# Patient Record
Sex: Female | Born: 1990
Health system: Southern US, Community
[De-identification: ages and names within clinical notes are randomized; demographics above are authoritative.]

## PROBLEM LIST (undated history)

## (undated) DIAGNOSIS — K829 Disease of gallbladder, unspecified: Secondary | ICD-10-CM

## (undated) DIAGNOSIS — E282 Polycystic ovarian syndrome: Secondary | ICD-10-CM

## (undated) DIAGNOSIS — E039 Hypothyroidism, unspecified: Secondary | ICD-10-CM

---

## 2016-02-10 ENCOUNTER — Emergency Department: Payer: Managed Care, Other (non HMO)

## 2016-02-10 ENCOUNTER — Emergency Department
Admission: EM | Admit: 2016-02-10 | Discharge: 2016-02-10 | Disposition: A | Discharge status: Home / Self Care | Payer: Managed Care, Other (non HMO) | Attending: Emergency Medicine | Admitting: Emergency Medicine

## 2016-02-10 ENCOUNTER — Encounter: Payer: Self-pay | Admitting: Emergency Medicine

## 2016-02-10 DIAGNOSIS — K829 Disease of gallbladder, unspecified: Secondary | ICD-10-CM | POA: Insufficient documentation

## 2016-02-10 DIAGNOSIS — K8070 Calculus of gallbladder and bile duct without cholecystitis without obstruction: Secondary | ICD-10-CM | POA: Insufficient documentation

## 2016-02-10 DIAGNOSIS — K8050 Calculus of bile duct without cholangitis or cholecystitis without obstruction: Secondary | ICD-10-CM

## 2016-02-10 DIAGNOSIS — R112 Nausea with vomiting, unspecified: Secondary | ICD-10-CM | POA: Insufficient documentation

## 2016-02-10 DIAGNOSIS — E282 Polycystic ovarian syndrome: Secondary | ICD-10-CM | POA: Diagnosis not present

## 2016-02-10 DIAGNOSIS — R1011 Right upper quadrant pain: Secondary | ICD-10-CM | POA: Diagnosis present

## 2016-02-10 DIAGNOSIS — E039 Hypothyroidism, unspecified: Secondary | ICD-10-CM | POA: Insufficient documentation

## 2016-02-10 HISTORY — DX: Hypothyroidism, unspecified: E03.9

## 2016-02-10 HISTORY — DX: Disease of gallbladder, unspecified: K82.9

## 2016-02-10 HISTORY — DX: Polycystic ovarian syndrome: E28.2

## 2016-02-10 LAB — TROPONIN I: Troponin I: <0.03 ng/mL (ref <0.031)

## 2016-02-10 LAB — URINALYSIS COMPLETE WITH MICROSCOPIC (ARMC ONLY)
BILIRUBIN URINE: NEGATIVE
Bacteria, UA: NONE SEEN
GLUCOSE, UA: NEGATIVE mg/dL
KETONES UR: NEGATIVE mg/dL
LEUKOCYTES UA: NEGATIVE
Nitrite: NEGATIVE
Protein, ur: NEGATIVE mg/dL
RBC / HPF: NONE SEEN RBC/hpf (ref 0–5)
Specific Gravity, Urine: 1.006 (ref 1.005–1.030)
pH: 6 (ref 5.0–8.0)

## 2016-02-10 LAB — CBC
HCT: 36 % (ref 35.0–47.0)
Hemoglobin: 12.2 g/dL (ref 12.0–16.0)
MCH: 28.2 pg (ref 26.0–34.0)
MCHC: 33.9 g/dL (ref 32.0–36.0)
MCV: 83 fL (ref 80.0–100.0)
PLATELETS: 248 10*3/uL (ref 150–440)
RBC: 4.33 MIL/uL (ref 3.80–5.20)
RDW: 14.1 % (ref 11.5–14.5)
WBC: 9.5 10*3/uL (ref 3.6–11.0)

## 2016-02-10 LAB — COMPREHENSIVE METABOLIC PANEL
ALT: 19 U/L (ref 14–54)
AST: 21 U/L (ref 15–41)
Albumin: 4.2 g/dL (ref 3.5–5.0)
Alkaline Phosphatase: 59 U/L (ref 38–126)
Anion gap: 6 (ref 5–15)
BILIRUBIN TOTAL: 0.2 mg/dL — AB (ref 0.3–1.2)
BUN: 14 mg/dL (ref 6–20)
CHLORIDE: 108 mmol/L (ref 101–111)
CO2: 24 mmol/L (ref 22–32)
CREATININE: 0.77 mg/dL (ref 0.44–1.00)
Calcium: 8.9 mg/dL (ref 8.9–10.3)
GFR calc Af Amer: >60 mL/min (ref >60)
Glucose, Bld: 90 mg/dL (ref 65–99)
Potassium: 3.7 mmol/L (ref 3.5–5.1)
Sodium: 138 mmol/L (ref 135–145)
Total Protein: 7.4 g/dL (ref 6.5–8.1)

## 2016-02-10 LAB — LIPASE, BLOOD: LIPASE: 17 U/L (ref 11–51)

## 2016-02-10 LAB — POCT PREGNANCY, URINE: Preg Test, Ur: NEGATIVE

## 2016-02-10 MED ORDER — ONDANSETRON 4 MG PO TBDP: 4.0000 mg | ORAL_TABLET | Freq: Three times a day (TID) | ORAL | Status: DC | PRN

## 2016-02-10 MED ORDER — OXYCODONE-ACETAMINOPHEN 5-325 MG PO TABS: 1.0000 | ORAL_TABLET | Freq: Four times a day (QID) | ORAL | Status: DC | PRN

## 2016-02-10 MED ORDER — ONDANSETRON 4 MG PO TBDP
ORAL_TABLET | ORAL | Status: AC
  Filled 2016-02-10: qty 2

## 2016-02-10 MED ORDER — ONDANSETRON 4 MG PO TBDP
8.0000 mg | ORAL_TABLET | Freq: Once | ORAL | Status: AC
  Administered 2016-02-10: 8 mg via ORAL

## 2016-02-10 NOTE — ED Provider Notes (Signed)
[facility] Emergency Department Provider Note  ____________________________________________  Time seen: 9:30 PM  I have reviewed the triage vital signs and the nursing notes.   HISTORY  Chief Complaint Abdominal Pain    HPI Anne Henderson is a 26 y.o. female who complains of right upper quadrant abdominal pain that started suddenly and severely at 5:00 PM today. It is associated with nausea and vomiting. Is worse with eating and she has not eaten anything since 11:30 AM today. She's also had 5 loose bowel movements today. No change in stool color. No bloody stool.  She reports she has a history of gallstones but has never seen a Careers adviser. No other recent illnesses. No chest pain or shortness of breath. The pain is improved with exertion and walking. Seems to be worse with eating. Most recent pain lasted about 2 hours and she is felt essentially pain-free since about 7:00 PM. She does still have persistent nausea.     Past Medical History  Diagnosis Date  . Hypothyroidism   . Gallbladder disease   . PCOS (polycystic ovarian syndrome)      There are no active problems to display for this patient.    History reviewed. No pertinent past surgical history.   No current outpatient prescriptions on file. None  Allergies Review of patient's allergies indicates no known allergies.   No family history on file.  Social History Social History  Substance Use Topics  . Smoking status: Never Smoker   . Smokeless tobacco: None  . Alcohol Use: No    Review of Systems  Constitutional:   No fever or chills. No weight changes Eyes:   No blurry vision or double vision.  ENT:   No sore throat.  Cardiovascular:   No chest pain. Respiratory:   No dyspnea or cough. Gastrointestinal:   Right upper quadrant abdominal pain with nausea and vomiting. Positive diarrhea today.Marland Kitchen  No BRBPR or melena. Genitourinary:   Negative for dysuria or difficulty  urinating. Musculoskeletal:   Negative for back pain. No joint swelling or pain. Skin:   Negative for rash. Neurological:   Negative for headaches, focal weakness or numbness. Psychiatric:  No anxiety or depression.   Endocrine:  No changes in energy or sleep difficulty.  10-point ROS otherwise negative.  ____________________________________________   PHYSICAL EXAM:  VITAL SIGNS: ED Triage Vitals  Enc Vitals Group     BP 02/10/16 1951 141/98 mmHg     Pulse Rate 02/10/16 1951 104     Resp 02/10/16 1951 20     Temp 02/10/16 1951 98.2 F (36.8 C)     Temp Source 02/10/16 1951 Oral     SpO2 02/10/16 1951 100 %     Weight 02/10/16 1951 250 lb (113.399 kg)     Height 02/10/16 1951  (1.651 m)     Head Cir --      Peak Flow --      Pain Score 02/10/16 1949 6     Pain Loc --      Pain Edu? --      Excl. in GC? --     Vital signs reviewed, nursing assessments reviewed.   Constitutional:   Alert and oriented. Well appearing and in no distress. Eyes:   No scleral icterus. No conjunctival pallor. PERRL. EOMI ENT   Head:   Normocephalic and atraumatic.   Nose:   No congestion/rhinnorhea. No septal hematoma   Mouth/Throat:   MMM, no pharyngeal erythema. No peritonsillar mass.  Neck:   No stridor. No SubQ emphysema. No meningismus. Hematological/Lymphatic/Immunilogical:   No cervical lymphadenopathy. Cardiovascular:   RRR. Symmetric bilateral radial and DP pulses.  No murmurs.  Respiratory:   Normal respiratory effort without tachypnea nor retractions. Breath sounds are clear and equal bilaterally. No wheezes/rales/rhonchi. Gastrointestinal:   Soft with right upper quadrant tenderness. Non distended. There is no CVA tenderness.  No rebound, rigidity, or guarding. Genitourinary:   deferred Musculoskeletal:   Nontender with normal range of motion in all extremities. No joint effusions.  No lower extremity tenderness.  No edema. Neurologic:   Normal speech and  language.  CN 2-10 normal. Motor grossly intact. No gross focal neurologic deficits are appreciated.  Skin:    Skin is warm, dry and intact. No rash noted.  No petechiae, purpura, or bullae. Psychiatric:   Mood and affect are normal. ____________________________________________    LABS (pertinent positives/negatives) (all labs ordered are listed, but only abnormal results are displayed) Labs Reviewed  COMPREHENSIVE METABOLIC PANEL - Abnormal; Notable for the following:    Total Bilirubin 0.2 (*)    All other components within normal limits  URINALYSIS COMPLETEWITH MICROSCOPIC ([facility] ONLY) - Abnormal; Notable for the following:    Color, Urine COLORLESS (*)    APPearance CLEAR (*)    Hgb urine dipstick 1+ (*)    Squamous Epithelial / LPF 0-5 (*)    All other components within normal limits  LIPASE, BLOOD  CBC  TROPONIN I  POC URINE PREG, ED  POCT PREGNANCY, URINE   ____________________________________________   EKG  Interpreted by me Normal sinus rhythm rate of 93, normal axis and intervals. Normal QRS ST segments. There are T-wave inversions in lead 3 and aVF. No previous EKGs to compare to.  ____________________________________________    RADIOLOGY  Ultrasound right upper quadrant negative.  ____________________________________________   PROCEDURES   ____________________________________________   INITIAL IMPRESSION / ASSESSMENT AND PLAN / ED COURSE  Pertinent labs & imaging results that were available during my care of the patient were reviewed by me and considered in my medical decision making (see chart for details).  Patient presents with right upper quadrant abdominal pain consistent with her previous experience with gallstones. By the patient's history this sounds like it is biliary colic. She does have some T-wave inversions in the inferior leads on her EKG, but her symptoms are not at all consistent with ACS PE TAD or carditis. Low suspicion for  pneumothorax pneumonia or sepsis. Low suspicion for obstruction perforation pancreatitis or appendicitis or AAA. Troponin is negative. Patient's symptoms are not consistent with an angina equivalent and in fact actually improved with exertion. We'll give him ODT Zofran for now I'll obtaining an ultrasound of the gallbladder. If no severe obstruction or infection, will do a trial of oral intake and plan for outpatient follow-up with surgery for evaluation for elective cholecystectomy.   ----------------------------------------- 10:39 PM on 02/10/2016 -----------------------------------------  Ultrasound negative. Vital signs normal. Patient remains calm and comfortable without additional pain in the ED. Tolerating oral intake at this time. We'll provide Zofran and Percocet on an as-needed basis only K she has another severe tach. Follow-up with primary care. If she has ongoing symptoms I encouraged her to follow up with surgery regardless of the negative ultrasound given that her symptoms do seem to match biliary colic or a well. No diabetes. Well-appearing, stable for discharge home.    ____________________________________________   FINAL CLINICAL IMPRESSION(S) / ED DIAGNOSES  Final diagnoses:  Biliary colic  Non-intractable vomiting with nausea, vomiting of unspecified type      Sharman Cheek, MD 02/10/16 2240

## 2016-02-10 NOTE — ED Notes (Signed)
Developed upper abd pain about 2 hours ago  Vomited times 1 pos nausea

## 2016-02-10 NOTE — ED Notes (Signed)
Patient ambulatory to triage with steady gait, without difficulty or distress noted; pt reports  Upper abd pain since 5pm accomp by diarrhea x 5 and V x1; st hx of gallbladder disease

## 2016-02-10 NOTE — ED Notes (Signed)
Patient transported to Ultrasound 

## 2016-02-10 NOTE — Discharge Instructions (Signed)
You were prescribed a medication that is potentially sedating. Do not drink alcohol, drive or participate in any other potentially dangerous activities while taking this medication as it may make you sleepy. Do not take this medication with any other sedating medications, either prescription or over-the-counter. If you were prescribed Percocet or Vicodin, do not take these with acetaminophen (Tylenol) as it is already contained within these medications.   Opioid pain medications (or "narcotics") can be habit forming.  Use it as little as possible to achieve adequate pain control.  Do not use or use it with extreme caution if you have a history of opiate abuse or dependence.  If you are on a pain contract with your primary care doctor or a pain specialist, be sure to let them know you were prescribed this medication today from the [facility].  This medication is intended for your use only - do not give any to anyone else and keep it in a secure place where nobody else, especially children and pets, have access to it.  It will also cause or worsen constipation, so you may want to consider taking an over-the-counter stool softener while you are taking this medication.  Nausea and Vomiting Nausea is a sick feeling that often comes before throwing up (vomiting). Vomiting is a reflex where stomach contents come out of your mouth. Vomiting can cause severe loss of body fluids (dehydration). Children and elderly adults can become dehydrated quickly, especially if they also have diarrhea. Nausea and vomiting are symptoms of a condition or disease. It is important to find the cause of your symptoms. CAUSES   Direct irritation of the stomach lining. This irritation can result from increased acid production (gastroesophageal reflux disease), infection, food poisoning, taking certain medicines (such as nonsteroidal anti-inflammatory drugs), alcohol use, or tobacco use.  Signals from the  brain.These signals could be caused by a headache, heat exposure, an inner ear disturbance, increased pressure in the brain from injury, infection, a tumor, or a concussion, pain, emotional stimulus, or metabolic problems.  An obstruction in the gastrointestinal tract (bowel obstruction).  Illnesses such as diabetes, hepatitis, gallbladder problems, appendicitis, kidney problems, cancer, sepsis, atypical symptoms of a heart attack, or eating disorders.  Medical treatments such as chemotherapy and radiation.  Receiving medicine that makes you sleep (general anesthetic) during surgery. DIAGNOSIS Your caregiver may ask for tests to be done if the problems do not improve after a few days. Tests may also be done if symptoms are severe or if the reason for the nausea and vomiting is not clear. Tests may include:  Urine tests.  Blood tests.  Stool tests.  Cultures (to look for evidence of infection).  X-rays or other imaging studies. Test results can help your caregiver make decisions about treatment or the need for additional tests. TREATMENT You need to stay well hydrated. Drink frequently but in small amounts.You may wish to drink water, sports drinks, clear broth, or eat frozen ice pops or gelatin dessert to help stay hydrated.When you eat, eating slowly may help prevent nausea.There are also some antinausea medicines that may help prevent nausea. HOME CARE INSTRUCTIONS   Take all medicine as directed by your caregiver.  If you do not have an appetite, do not force yourself to eat. However, you must continue to drink fluids.  If you have an appetite, eat a normal diet unless your caregiver tells you differently.  Eat a variety of complex carbohydrates (rice, wheat, potatoes, bread), lean meats, yogurt,  fruits, and vegetables.  Avoid high-fat foods because they are more difficult to digest.  Drink enough water and fluids to keep your urine clear or pale yellow.  If you are  dehydrated, ask your caregiver for specific rehydration instructions. Signs of dehydration may include:  Severe thirst.  Dry lips and mouth.  Dizziness.  Dark urine.  Decreasing urine frequency and amount.  Confusion.  Rapid breathing or pulse. SEEK IMMEDIATE MEDICAL CARE IF:   You have blood or brown flecks (like coffee grounds) in your vomit.  You have black or bloody stools.  You have a severe headache or stiff neck.  You are confused.  You have severe abdominal pain.  You have chest pain or trouble breathing.  You do not urinate at least once every 8 hours.  You develop cold or clammy skin.  You continue to vomit for longer than 24 to 48 hours.  You have a fever. MAKE SURE YOU:   Understand these instructions.  Will watch your condition.  Will get help right away if you are not doing well or get worse.   This information is not intended to replace advice given to you by your health care provider. Make sure you discuss any questions you have with your health care provider.   Document Released: 11/15/2005 Document Revised: 02/07/2012 Document Reviewed: 04/14/2011 Elsevier Interactive Patient Education [employer]

## 2016-02-10 NOTE — ED Notes (Signed)
Per MD started PO challenge - gave cup of water and saltine crackers

## 2016-02-10 NOTE — ED Notes (Signed)
Pt reports abd pain for 1 day - She reports that she has gall bladder problems and that this pain feels like the gall bladder pain that she usually has every two weeks or so - Pt report vomiting and diarrhea

## 2016-07-30 ENCOUNTER — Encounter: Payer: Self-pay | Admitting: Physician Assistant

## 2016-07-30 ENCOUNTER — Ambulatory Visit: Payer: Self-pay | Admitting: Physician Assistant

## 2016-07-30 VITALS — BP 110/70 | HR 66 | Temp 98.4°F

## 2016-07-30 DIAGNOSIS — R109 Unspecified abdominal pain: Secondary | ICD-10-CM

## 2016-07-30 NOTE — Progress Notes (Signed)
   Subjective:    Patient ID: Anne Henderson, female    DOB: 09-18-1991, 25 y.o.   MRN: [id number]  HPI Patient c/o 3 days of diffuse abdomen  pain. One episode of vomiting 2 days ago. Patient also c/o fatigue, decreased appetite and constipation.Patient states urinary urgency and frequency but no compliant today. Denies dysuria.   Review of Systems  Hypothyroidism and PCOS    Objective:   Physical Exam No acute distress. Obese. HEENT unremarkable. Neck supple with adenopathy. Lungs CTA. Heart RRR. Obese abdomen. Negative HSM. Decrease BS. Diffused guarding mid and lower abdomen.       Assessment & Plan:Abdominal pain. Hypothyroidism, and APCOS.  CMP, TSH, HgA1c, and UA today. Follow up in 4 days.

## 2016-07-31 LAB — CMP12+LP+TP+TSH+6AC+CBC/D/PLT
A/G RATIO: 1.8 (ref 1.2–2.2)
ALBUMIN: 4.4 g/dL (ref 3.5–5.5)
ALK PHOS: 68 IU/L (ref 39–117)
ALT: 14 IU/L (ref 0–32)
AST: 15 IU/L (ref 0–40)
BASOS ABS: 0 10*3/uL (ref 0.0–0.2)
BASOS: 0 %
BUN / CREAT RATIO: 13 (ref 9–23)
BUN: 10 mg/dL (ref 6–20)
CHLORIDE: 102 mmol/L (ref 96–106)
CHOLESTEROL TOTAL: 186 mg/dL (ref 100–199)
Calcium: 9.1 mg/dL (ref 8.7–10.2)
Chol/HDL Ratio: 4 ratio units (ref 0.0–4.4)
Creatinine, Ser: 0.78 mg/dL (ref 0.57–1.00)
EOS (ABSOLUTE): 0.1 10*3/uL (ref 0.0–0.4)
EOS: 2 %
ESTIMATED CHD RISK: 0.9 times avg. (ref 0.0–1.0)
FREE THYROXINE INDEX: 1.6 (ref 1.2–4.9)
GFR calc Af Amer: 123 mL/min/{1.73_m2} (ref >59)
GFR calc non Af Amer: 107 mL/min/{1.73_m2} (ref >59)
GGT: 10 IU/L (ref 0–60)
GLUCOSE: 99 mg/dL (ref 65–99)
Globulin, Total: 2.5 g/dL (ref 1.5–4.5)
HDL: 46 mg/dL (ref >39)
HEMATOCRIT: 37.7 % (ref 34.0–46.6)
HEMOGLOBIN: 12.6 g/dL (ref 11.1–15.9)
IMMATURE GRANS (ABS): 0 10*3/uL (ref 0.0–0.1)
IMMATURE GRANULOCYTES: 0 %
IRON: 30 ug/dL (ref 27–159)
LDH: 154 IU/L (ref 119–226)
LDL CALC: 125 mg/dL — AB (ref 0–99)
LYMPHS ABS: 1.7 10*3/uL (ref 0.7–3.1)
LYMPHS: 25 %
MCH: 27.8 pg (ref 26.6–33.0)
MCHC: 33.4 g/dL (ref 31.5–35.7)
MCV: 83 fL (ref 79–97)
MONOCYTES: 9 %
Monocytes Absolute: 0.6 10*3/uL (ref 0.1–0.9)
NEUTROS PCT: 64 %
Neutrophils Absolute: 4.4 10*3/uL (ref 1.4–7.0)
PLATELETS: 301 10*3/uL (ref 150–379)
Phosphorus: 3.1 mg/dL (ref 2.5–4.5)
Potassium: 4.3 mmol/L (ref 3.5–5.2)
RBC: 4.54 x10E6/uL (ref 3.77–5.28)
RDW: 14.4 % (ref 12.3–15.4)
SODIUM: 139 mmol/L (ref 134–144)
T3 UPTAKE RATIO: 27 % (ref 24–39)
T4, Total: 6 ug/dL (ref 4.5–12.0)
TOTAL PROTEIN: 6.9 g/dL (ref 6.0–8.5)
TSH: 4.53 u[IU]/mL — ABNORMAL HIGH (ref 0.450–4.500)
Triglycerides: 74 mg/dL (ref 0–149)
Uric Acid: 5.9 mg/dL (ref 2.5–7.1)
VLDL CHOLESTEROL CAL: 15 mg/dL (ref 5–40)
WBC: 6.8 10*3/uL (ref 3.4–10.8)

## 2016-07-31 LAB — URINALYSIS, COMPLETE
BILIRUBIN UA: NEGATIVE
Glucose, UA: NEGATIVE
KETONES UA: NEGATIVE
Leukocytes, UA: NEGATIVE
Nitrite, UA: NEGATIVE
PH UA: 6.5 (ref 5.0–7.5)
Protein, UA: NEGATIVE
RBC UA: NEGATIVE
Specific Gravity, UA: 1.021 (ref 1.005–1.030)
Urobilinogen, Ur: 0.2 mg/dL (ref 0.2–1.0)

## 2016-07-31 LAB — MICROSCOPIC EXAMINATION
Bacteria, UA: NONE SEEN
Casts: NONE SEEN /lpf

## 2016-07-31 LAB — HGB A1C W/O EAG: Hgb A1c MFr Bld: 5.6 % (ref 4.8–5.6)

## 2016-08-03 ENCOUNTER — Other Ambulatory Visit: Payer: Self-pay | Admitting: Physician Assistant

## 2016-08-03 ENCOUNTER — Encounter: Payer: Self-pay | Admitting: Physician Assistant

## 2016-08-03 ENCOUNTER — Ambulatory Visit: Payer: Self-pay | Admitting: Physician Assistant

## 2016-08-03 VITALS — BP 110/70 | HR 66 | Temp 98.5°F

## 2016-08-03 DIAGNOSIS — R1030 Lower abdominal pain, unspecified: Secondary | ICD-10-CM

## 2016-08-03 MED ORDER — LEVOTHYROXINE SODIUM 75 MCG PO TABS: 75.0000 ug | ORAL_TABLET | Freq: Every day | ORAL | 3 refills | Status: DC

## 2016-08-03 NOTE — Progress Notes (Signed)
S: here for lab review, states is feeling better than she did on Friday, states she keeps having this abdominal pain on and off, usually is upper gi pain after eating fatty foods and will feel like she needs to run to the bathroom,  this time it was near her belly button and radiated to the rlq, did have fever at the time but none now, no v/d, no blood in stool or urine, hx of PCO, Hashimoto thyroid, was also told she is having gallbladder issues, had an US done in the ER in ?March but they didn't find anything, sx have continued on and off and continues to take zofran to help with constant nausea,   O: vitals wnl, nad, abd soft mildly tender in rlq and ruq, bs normal all 4 quads, lungs c t a, cv rrr, neuro intact, labs reviewed  A: hypothyroidism, abdominal pain  P: increase synthroid to 75mcg, add labs for h. Pylori and protein allergy, pt states already had celiac workup but it was neg, refer to GI for eval of ? nonfunctioning gallbladder and abdominal pain

## 2016-08-06 LAB — H PYLORI, IGM, IGG, IGA AB
H pylori, IgM Abs: <9 units (ref 0.0–8.9)
H. pylori, IgA Abs: <9 units (ref 0.0–8.9)

## 2016-08-06 LAB — ALPHA GAL IGE

## 2016-08-10 NOTE — Progress Notes (Signed)
Patient ID: Anne Henderson, female   DOB: 02-Nov-1991, 25 y.o.   MRN: [id number] Patient have been [facility] to [facility].  Her appointment is 08/17/2016 @ 9am.  Patient has been notified.

## 2016-08-17 ENCOUNTER — Other Ambulatory Visit: Payer: Self-pay | Admitting: Nurse Practitioner

## 2016-08-17 DIAGNOSIS — R112 Nausea with vomiting, unspecified: Secondary | ICD-10-CM

## 2016-08-20 ENCOUNTER — Ambulatory Visit
Admission: RE | Admit: 2016-08-20 | Discharge: 2016-08-20 | Disposition: A | Discharge status: Home / Self Care | Payer: Managed Care, Other (non HMO) | Source: Ambulatory Visit | Attending: Nurse Practitioner | Admitting: Nurse Practitioner

## 2016-08-20 ENCOUNTER — Other Ambulatory Visit: Payer: Self-pay | Admitting: Nurse Practitioner

## 2016-08-20 DIAGNOSIS — R112 Nausea with vomiting, unspecified: Secondary | ICD-10-CM | POA: Diagnosis not present

## 2016-08-20 MED ORDER — TECHNETIUM TC 99M MEBROFENIN IV KIT
5.0000 | PACK | Freq: Once | INTRAVENOUS | Status: AC | PRN
  Administered 2016-08-20: 4.76 via INTRAVENOUS

## 2016-11-11 ENCOUNTER — Encounter: Payer: Self-pay | Admitting: *Deleted

## 2016-11-12 ENCOUNTER — Encounter: Payer: Self-pay | Admitting: Anesthesiology

## 2016-11-12 ENCOUNTER — Ambulatory Visit: Payer: Managed Care, Other (non HMO) | Admitting: Anesthesiology

## 2016-11-12 ENCOUNTER — Ambulatory Visit
Admission: RE | Admit: 2016-11-12 | Discharge: 2016-11-12 | Disposition: A | Discharge status: Home / Self Care | Payer: Managed Care, Other (non HMO) | Source: Ambulatory Visit | Attending: Unknown Physician Specialty | Admitting: Unknown Physician Specialty

## 2016-11-12 ENCOUNTER — Encounter
Admission: RE | Disposition: A | Discharge status: Home / Self Care | Payer: Self-pay | Source: Ambulatory Visit | Attending: Unknown Physician Specialty

## 2016-11-12 DIAGNOSIS — K529 Noninfective gastroenteritis and colitis, unspecified: Secondary | ICD-10-CM | POA: Insufficient documentation

## 2016-11-12 DIAGNOSIS — E282 Polycystic ovarian syndrome: Secondary | ICD-10-CM | POA: Diagnosis not present

## 2016-11-12 DIAGNOSIS — K2960 Other gastritis without bleeding: Secondary | ICD-10-CM | POA: Diagnosis not present

## 2016-11-12 DIAGNOSIS — E039 Hypothyroidism, unspecified: Secondary | ICD-10-CM | POA: Insufficient documentation

## 2016-11-12 DIAGNOSIS — Z79899 Other long term (current) drug therapy: Secondary | ICD-10-CM | POA: Insufficient documentation

## 2016-11-12 DIAGNOSIS — Z6841 Body Mass Index (BMI) 40.0 and over, adult: Secondary | ICD-10-CM | POA: Diagnosis not present

## 2016-11-12 DIAGNOSIS — Z7984 Long term (current) use of oral hypoglycemic drugs: Secondary | ICD-10-CM | POA: Insufficient documentation

## 2016-11-12 DIAGNOSIS — R110 Nausea: Secondary | ICD-10-CM | POA: Diagnosis present

## 2016-11-12 HISTORY — PX: COLONOSCOPY WITH PROPOFOL: SHX5780

## 2016-11-12 HISTORY — PX: ESOPHAGOGASTRODUODENOSCOPY (EGD) WITH PROPOFOL: SHX5813

## 2016-11-12 LAB — POCT PREGNANCY, URINE: Preg Test, Ur: NEGATIVE

## 2016-11-12 SURGERY — COLONOSCOPY WITH PROPOFOL
Anesthesia: General

## 2016-11-12 MED ORDER — SODIUM CHLORIDE 0.9 % IV SOLN: INTRAVENOUS | Status: DC

## 2016-11-12 MED ORDER — GLYCOPYRROLATE 0.2 MG/ML IJ SOLN
INTRAMUSCULAR | Status: DC | PRN
  Administered 2016-11-12: 0.2 mg via INTRAVENOUS

## 2016-11-12 MED ORDER — LIDOCAINE 2% (20 MG/ML) 5 ML SYRINGE
INTRAMUSCULAR | Status: DC | PRN
  Administered 2016-11-12: 40 mg via INTRAVENOUS

## 2016-11-12 MED ORDER — PROPOFOL 10 MG/ML IV BOLUS
INTRAVENOUS | Status: DC | PRN
  Administered 2016-11-12: 100 mg via INTRAVENOUS

## 2016-11-12 MED ORDER — PROPOFOL 500 MG/50ML IV EMUL
INTRAVENOUS | Status: DC | PRN
  Administered 2016-11-12: 180 ug/kg/min via INTRAVENOUS

## 2016-11-12 MED ORDER — FENTANYL CITRATE (PF) 100 MCG/2ML IJ SOLN
INTRAMUSCULAR | Status: DC | PRN
  Administered 2016-11-12: 50 ug via INTRAVENOUS

## 2016-11-12 MED ORDER — SODIUM CHLORIDE 0.9 % IV SOLN
INTRAVENOUS | Status: DC
  Administered 2016-11-12: 1000 mL via INTRAVENOUS
  Administered 2016-11-12: 10:00:00 via INTRAVENOUS

## 2016-11-12 MED ORDER — LIDOCAINE HCL (PF) 1 % IJ SOLN
2.0000 mL | Freq: Once | INTRAMUSCULAR | Status: DC
  Filled 2016-11-12: qty 2

## 2016-11-12 MED ORDER — MIDAZOLAM HCL 5 MG/5ML IJ SOLN
INTRAMUSCULAR | Status: DC | PRN
  Administered 2016-11-12: 1 mg via INTRAVENOUS

## 2016-11-12 MED ORDER — PHENYLEPHRINE HCL 10 MG/ML IJ SOLN
INTRAMUSCULAR | Status: DC | PRN
  Administered 2016-11-12: 100 ug via INTRAVENOUS

## 2016-11-12 NOTE — Anesthesia Preprocedure Evaluation (Signed)
Anesthesia Evaluation  Patient identified by MRN, date of birth, ID band Patient awake    Reviewed: Allergy & Precautions, H&P , NPO status , Patient's Chart, lab work & pertinent test results, reviewed documented beta blocker date and time   History of Anesthesia Complications Negative for: history of anesthetic complications  Airway Mallampati: I  TM Distance: >3 FB Neck ROM: full    Dental no notable dental hx. (+) Chipped, Teeth Intact   Pulmonary neg pulmonary ROS,    Pulmonary exam normal breath sounds clear to auscultation       Cardiovascular Exercise Tolerance: Good negative cardio ROS Normal cardiovascular exam Rhythm:regular Rate:Normal     Neuro/Psych negative neurological ROS  negative psych ROS   GI/Hepatic negative GI ROS, Neg liver ROS,   Endo/Other  neg diabetesHypothyroidism Morbid obesity  Renal/GU negative Renal ROS  negative genitourinary   Musculoskeletal   Abdominal   Peds  Hematology negative hematology ROS (+)   Anesthesia Other Findings Past Medical History: No date: Gallbladder disease No date: Hypothyroidism No date: PCOS (polycystic ovarian syndrome)   Reproductive/Obstetrics negative OB ROS                             Anesthesia Physical Anesthesia Plan  ASA: III  Anesthesia Plan: General   Post-op Pain Management:    Induction:   Airway Management Planned:   Additional Equipment:   Intra-op Plan:   Post-operative Plan:   Informed Consent: I have reviewed the patients History and Physical, chart, labs and discussed the procedure including the risks, benefits and alternatives for the proposed anesthesia with the patient or authorized representative who has indicated his/her understanding and acceptance.   Dental Advisory Given  Plan Discussed with: Anesthesiologist, CRNA and Surgeon  Anesthesia Plan Comments:         Anesthesia  Quick Evaluation

## 2016-11-12 NOTE — Op Note (Signed)
[facility] Gastroenterology Patient Name: Anne FermoWhitney Todisco Procedure Date: 11/12/2016 10:06 AM MRN: [id number] Account #: [id number] Date of Birth: February 17, 1991 Admit Type: Outpatient Age: [age] [facility] ENDO ROOM 4 Gender: Female Note Status: Finalized Procedure:            Colonoscopy Indications:          Chronic diarrhea Providers:            Scot Junobert T. Xzaiver Vayda, MD Referring MD:         No Local Md, MD (Referring MD) Medicines:            Propofol per Anesthesia Complications:        No immediate complications. Procedure:            Pre-Anesthesia Assessment:                       - After reviewing the risks and benefits, the patient                        was deemed in satisfactory condition to undergo the                        procedure.                       After obtaining informed consent, the colonoscope was                        passed under direct vision. Throughout the procedure,                        the patient's blood pressure, pulse, and oxygen                        saturations were monitored continuously. The                        Colonoscope was introduced through the anus and                        advanced to the the cecum, identified by appendiceal                        orifice and ileocecal valve. The colonoscopy was                        performed without difficulty. The patient tolerated the                        procedure well. The quality of the bowel preparation                        was excellent. Findings:      Normal mucosa was found in the entire colon. Biopsies were taken with a       cold forceps for histology due to chronic diarrhea.. Done of ascending,       transverse, descending and sigmoid colon. Terminal ileum entered and was       normal also. Impression:           - Normal mucosa in the entire examined colon. Biopsied. Recommendation:       -  Await pathology results. Discuss metformin. Scot Junobert T Bambie Pizzolato,  MD 11/12/2016 10:41:28 AM This report has been signed electronically. Number of Addenda: 0 Note Initiated On: 11/12/2016 10:06 AM Scope Withdrawal Time: 0 hours 8 minutes 24 seconds  Total Procedure Duration: 0 hours 13 minutes 24 seconds       [facility]

## 2016-11-12 NOTE — Transfer of Care (Signed)
Immediate Anesthesia Transfer of Care Note  Patient: Anne FermoWhitney Pall  Procedure(s) Performed: Procedure(s): COLONOSCOPY WITH PROPOFOL (N/A) ESOPHAGOGASTRODUODENOSCOPY (EGD) WITH PROPOFOL (N/A)  Patient Location: PACU and Endoscopy Unit  Anesthesia Type:General  Level of Consciousness: awake and patient cooperative  Airway & Oxygen Therapy: Patient Spontanous Breathing and Patient connected to nasal cannula oxygen  Post-op Assessment: Report given to RN and Post -op Vital signs reviewed and stable  Post vital signs: Reviewed and stable  Last Vitals:  Vitals:   11/12/16 0919  BP: 117/70  Pulse: 76  Resp: 17  Temp: (!) 35.6 C    Last Pain:  Vitals:   11/12/16 0919  TempSrc: Tympanic         Complications: No apparent anesthesia complications

## 2016-11-12 NOTE — Anesthesia Postprocedure Evaluation (Signed)
Anesthesia Post Note  Patient: Anne Henderson  Procedure(s) Performed: Procedure(s) (LRB): COLONOSCOPY WITH PROPOFOL (N/A) ESOPHAGOGASTRODUODENOSCOPY (EGD) WITH PROPOFOL (N/A)  Patient location during evaluation: Endoscopy Anesthesia Type: General Level of consciousness: awake and alert Pain management: pain level controlled Vital Signs Assessment: post-procedure vital signs reviewed and stable Respiratory status: spontaneous breathing, nonlabored ventilation, respiratory function stable and patient connected to nasal cannula oxygen Cardiovascular status: blood pressure returned to baseline and stable Postop Assessment: no signs of nausea or vomiting Anesthetic complications: no    Last Vitals:  Vitals:   11/12/16 1116 11/12/16 1126  BP: 117/64 112/64  Pulse: 62 63  Resp: 16 19  Temp:      Last Pain:  Vitals:   11/12/16 1046  TempSrc: Tympanic  PainSc: Asleep                 Lenard SimmerAndrew Crandall [place]

## 2016-11-12 NOTE — Op Note (Signed)
[facility] Gastroenter[facility] Patient Name: Anne FermoWhitney Ruggles Procedure Date: 11/12/2016 10:06 AM MRN: [id number] Account #: [id number] Date of Birth: [birth year] Admit Type: Outpatient Age: [age] [facility] 4 Gender: Female Note Status: Finalized Procedure:            Upper GI endoscopy Indications:          Diarrhea, Nausea Providers:            Scot Junobert T. Elliott, MD Referring MD:         No Local Md, MD (Referring MD) Medicines:            Propofol per Anesthesia Complications:        No immediate complications. Procedure:            Pre-Anesthesia Assessment:                       - After reviewing the risks and benefits, the patient                        was deemed in satisfactory condition to undergo the                        procedure.                       After obtaining informed consent, the endoscope was                        passed under direct vision. Throughout the procedure,                        the patient's blood pressure, pulse, and oxygen                        saturations were monitored continuously. The Endoscope                        was introduced through the mouth, and advanced to the                        second part of duodenum. The upper GI endoscopy was                        accomplished without difficulty. The patient tolerated                        the procedure well. Findings:      The examined esophagus was normal. GEJ 39cm.      Three dispersed, small non-bleeding erosions were found in the gastric       antrum. There were no stigmata of recent bleeding. Biopsies were taken       with a cold forceps for histology. Biopsies were taken with a cold       forceps for Helicobacter pylori testing.      The examined duodenum was normal. Biopsies were taken with a cold       forceps for histology. Done for possible celiac disease. Impression:           - Normal esophagus.                       -  Non-bleeding erosive  gastropathy. Biopsied.                       - Normal examined duodenum. Biopsied. Recommendation:       - Await pathology results. Take Carafate and Omeprazole                        for 8 weeks, continue Omeprazole if continue ibuprofen. Scot Junobert T Elliott, MD 11/12/2016 10:22:23 AM This report has been signed electronically. Number of Addenda: 0 Note Initiated On: 11/12/2016 10:06 AM      [facility]

## 2016-11-12 NOTE — H&P (Signed)
   Primary Care Physician:  No PCP Per Patient Primary Gastroenterologist:  Dr. Mechele CollinElliott  Pre-Procedure History & Physical: HPI:  Anne Henderson is a [age] y.o. female is here for an endoscopy and colonoscopy.   Past Medical History:  Diagnosis Date  . Gallbladder disease   . Hypothyroidism   . PCOS (polycystic ovarian syndrome)     History reviewed. No pertinent surgical history.  Prior to Admission medications   Medication Sig Start Date End Date Taking? Authorizing Provider  levothyroxine (SYNTHROID, LEVOTHROID) 75 MCG tablet Take 1 tablet (75 mcg total) by mouth daily. 08/03/16   Faythe GheeSusan W Fisher, PA-C  metFORMIN (GLUCOPHAGE) 500 MG tablet Take 500 mg by mouth 3 (three) times daily.    Historical Provider, MD  ondansetron (ZOFRAN ODT) 4 MG disintegrating tablet Take 1 tablet (4 mg total) by mouth every 8 (eight) hours as needed for nausea or vomiting. Patient not taking: Reported on 07/30/2016 02/10/16   Sharman CheekPhillip Stafford, MD  oxyCODONE-acetaminophen (ROXICET) 5-325 MG tablet Take 1 tablet by mouth every 6 (six) hours as needed for severe pain. Patient not taking: Reported on 07/30/2016 02/10/16   Sharman CheekPhillip Stafford, MD    Allergies as of 10/27/2016  . (No Known Allergies)    History reviewed. No pertinent family history.  Social History   Social History  . Marital status: Single    Spouse name: N/A  . Number of children: N/A  . Years of education: N/A   Occupational History  . Not on file.   Social History Main Topics  . Smoking status: Never Smoker  . Smokeless tobacco: Not on file  . Alcohol use No  . Drug use: Unknown  . Sexual activity: Not on file   Other Topics Concern  . Not on file   Social History Narrative  . No narrative on file    Review of Systems: See HPI, otherwise negative ROS  Physical Exam: BP 117/70   Pulse 76   Temp (!) 96.1 F (35.6 C) (Tympanic)   Resp 17   Ht 5\' 6"  (1.676 m)   Wt 113.4 kg (250 lb)   SpO2 99%   BMI 40.35 kg/m  General:    Alert,  pleasant and cooperative in NAD Head:  Normocephalic and atraumatic. Neck:  Supple; no masses or thyromegaly. Lungs:  Clear throughout to auscultation.    Heart:  Regular rate and rhythm. Abdomen:  Soft, nontender and nondistended. Normal bowel sounds, without guarding, and without rebound.   Neurologic:  Alert and  oriented x4;  grossly normal neurologically.  Impression/Plan: Anne Henderson is here for an endoscopy and colonoscopy to be performed for chronic diarrhea  Risks, benefits, limitations, and alternatives regarding  endoscopy and colonoscopy have been reviewed with the patient.  Questions have been answered.  All parties agreeable.   Lynnae PrudeELLIOTT, Nelton Amsden, MD  11/12/2016, 10:03 AM

## 2016-11-13 ENCOUNTER — Encounter: Payer: Self-pay | Admitting: Unknown Physician Specialty

## 2016-11-15 LAB — SURGICAL PATHOLOGY

## 2017-02-19 IMAGING — US US ABDOMEN LIMITED
1 series · 14 of 25 positions shown · non-contrast
Comparison: 02/10/2016

CLINICAL DATA: Nausea, vomiting

EXAM:
US ABDOMEN LIMITED - RIGHT UPPER QUADRANT

[Series 1: us abdomen limited · 0.25mm/px · 14 of 52 slices shown]
[im 1/52]
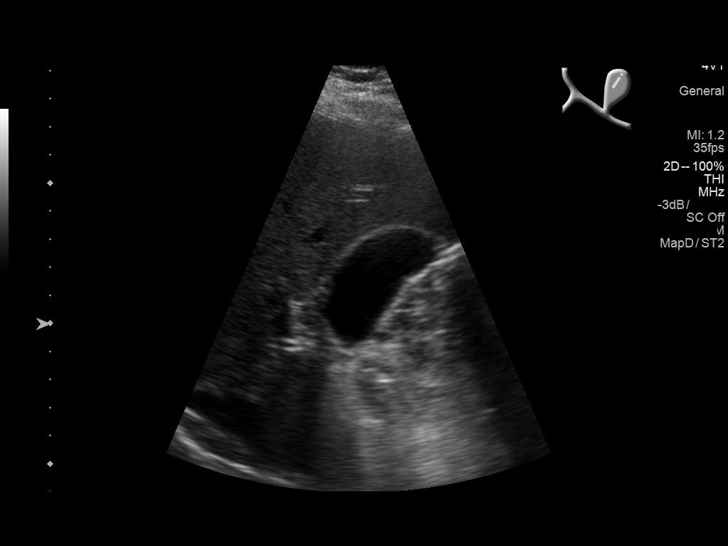
[im 5/52]
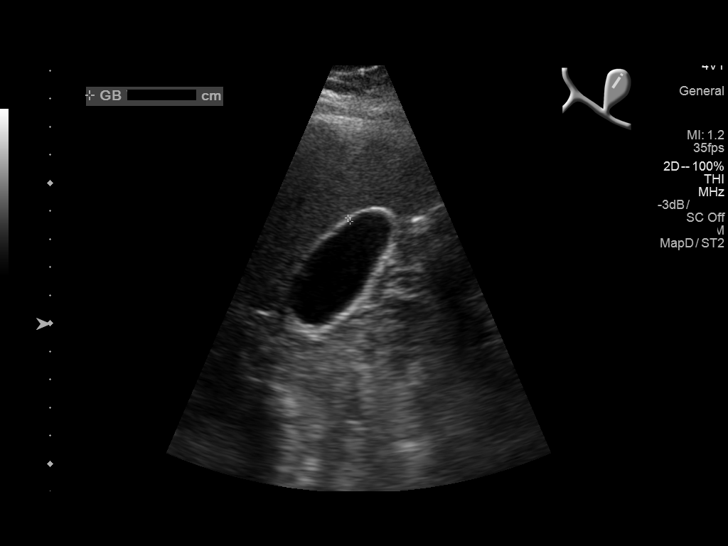
[im 9/52]
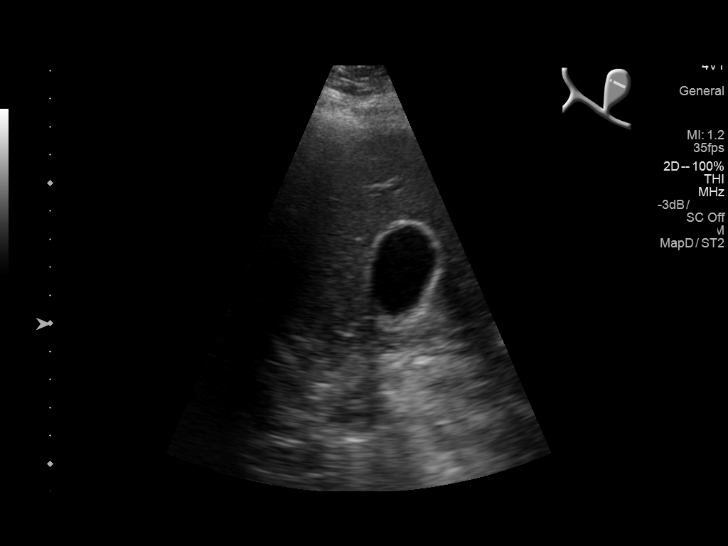
[im 13/52]
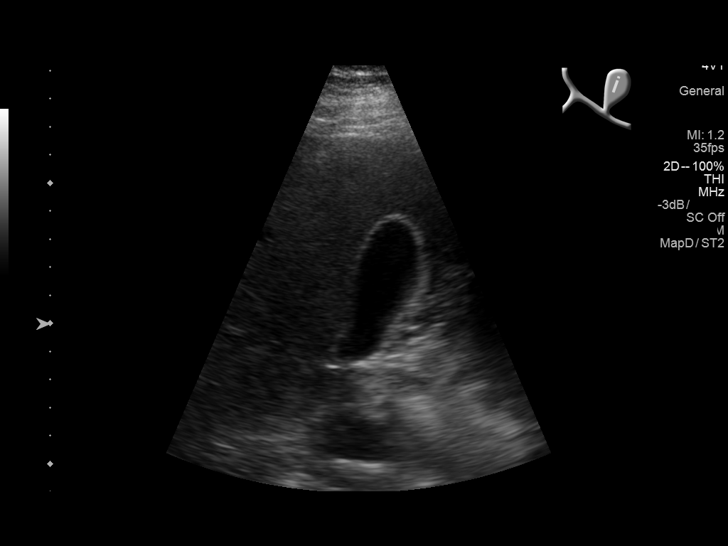
[im 18/52]
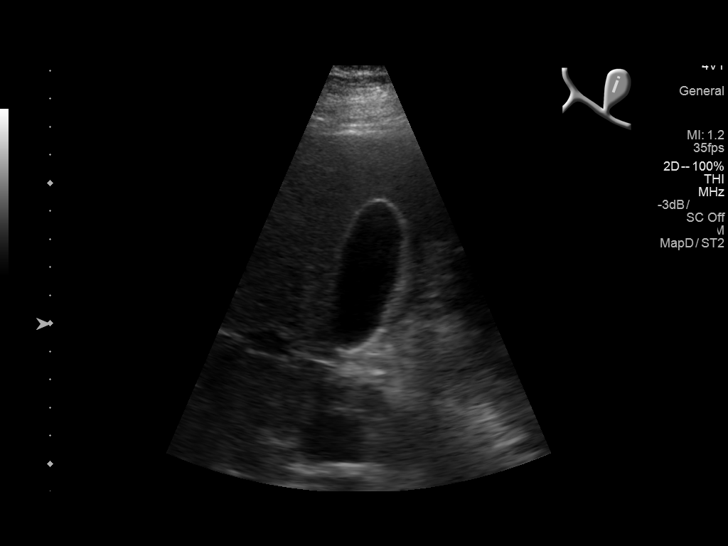
[im 20/52]
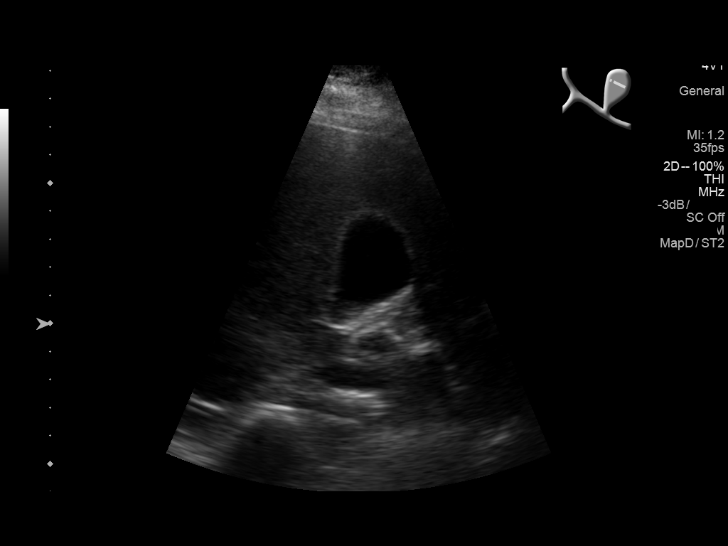
[im 24/52]
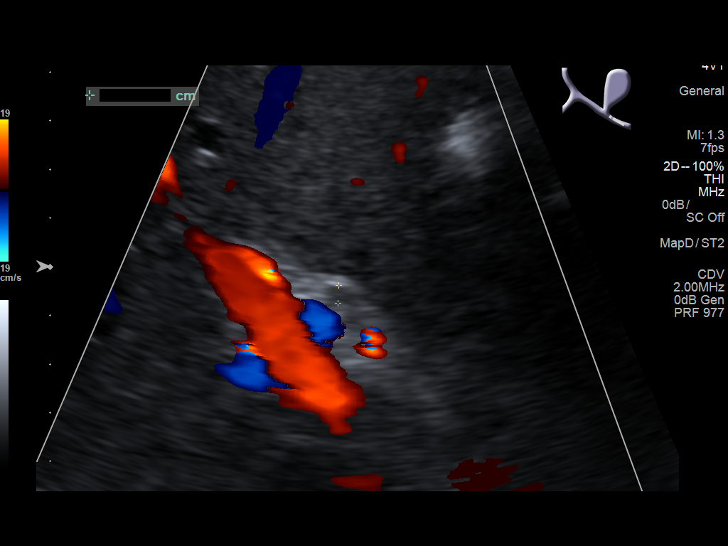
[im 28/52]
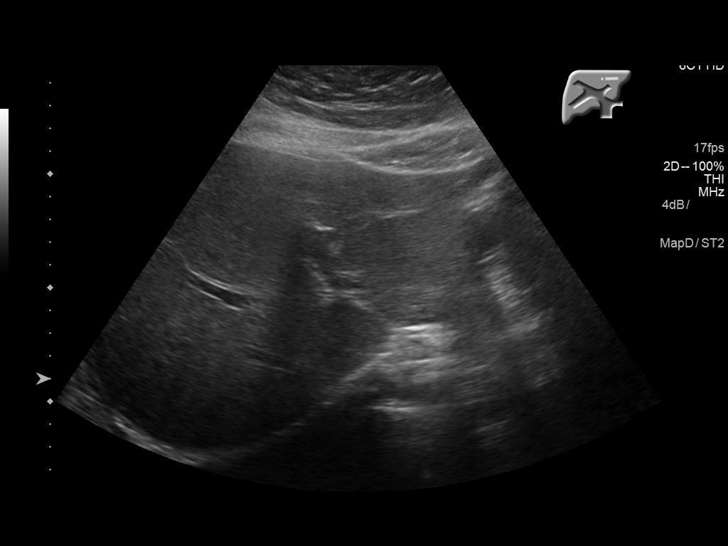
[im 32/52]
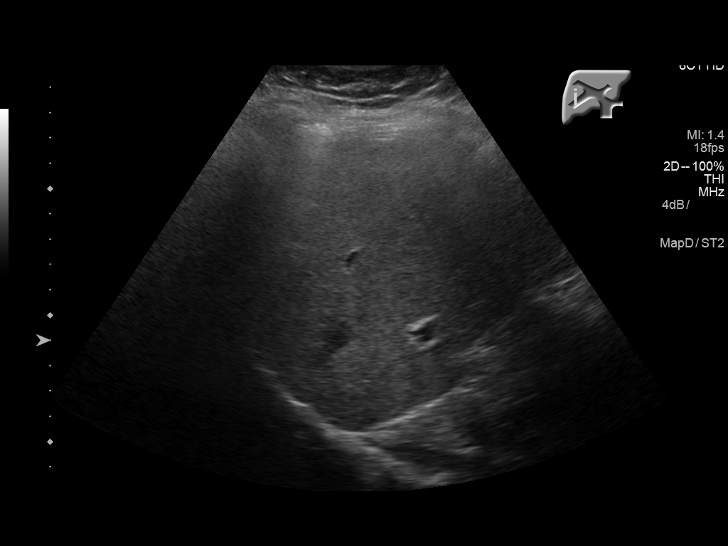
[im 35/52]
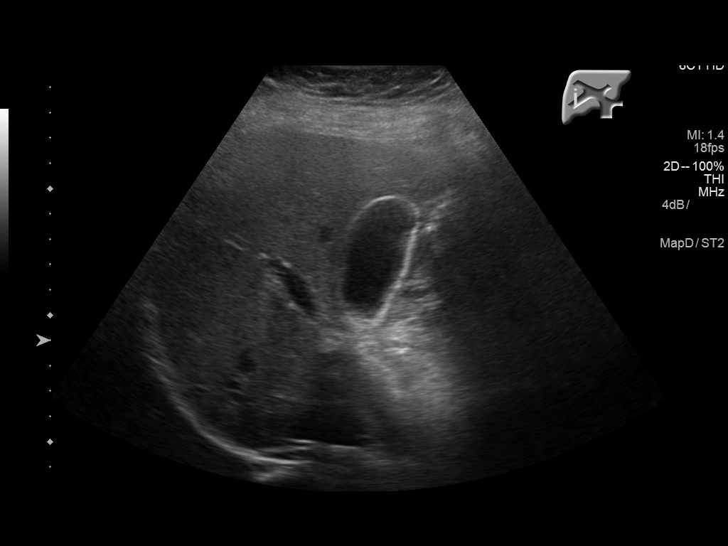
[im 39/52]
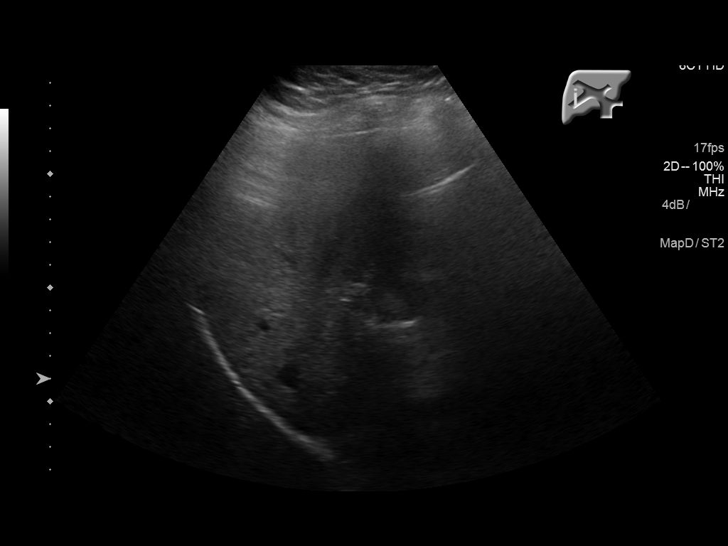
[im 43/52]
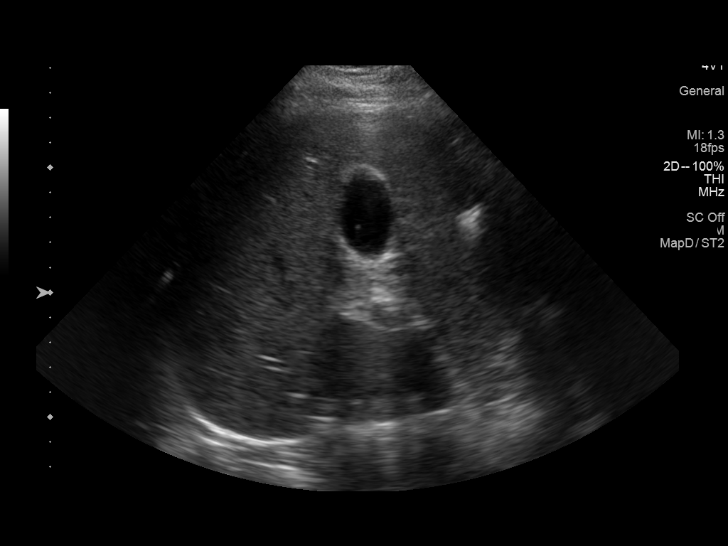
[im 47/52]
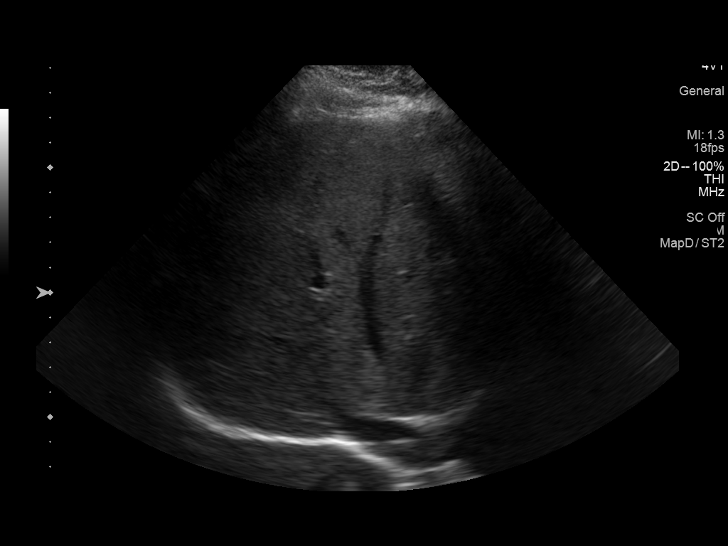
[im 52/52]
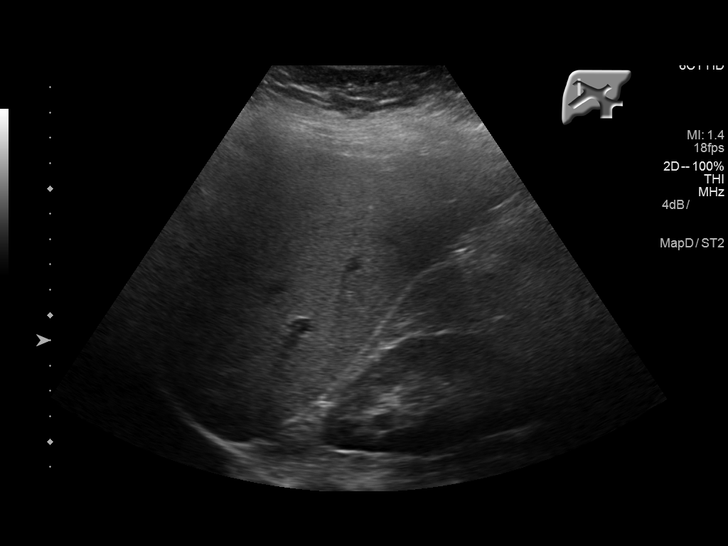

[14 of 25 positions shown; findings below may reference images not displayed]

FINDINGS: Gallbladder:

No gallstones or wall thickening visualized. No sonographic Murphy
sign noted by sonographer.

Common bile duct:

Diameter: Normal caliber, 4 mm

Liver:

No focal lesion identified. Within normal limits in parenchymal
echogenicity.
IMPRESSION: Unremarkable right upper quadrant ultrasound.

## 2017-04-01 IMAGING — NM NM HEPATO W/GB/PHARM/[PERSON_NAME]
2 series · 12 of 12 positions shown · non-contrast
Comparison: 08/20/2016 ultrasound

CLINICAL DATA: Nausea and vomiting

EXAM:
NUCLEAR MEDICINE HEPATOBILIARY IMAGING WITH GALLBLADDER EF
TECHNIQUE: Sequential images of the abdomen were obtained [DATE] minutes
following intravenous administration of radiopharmaceutical. After
oral ingestion of Ensure, gallbladder ejection fraction was
determined. At 60 min, normal ejection fraction is greater than 33%.
RADIOPHARMACEUTICALS:  4.8 mCi 3c-77m  Choletec IV

[Series 1000: hepatobiliary scan · 9.59mm/px · 6 of 60 frames shown]
[frame 6/60]
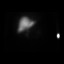
[frame 16/60]
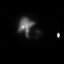
[frame 26/60]
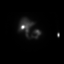
[frame 36/60]
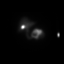
[frame 46/60]
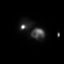
[frame 56/60]
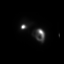

[Series 1000: gallbladder ef · 4.80mm/px · 6 of 120 frames shown]
[frame 11/120]
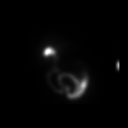
[frame 31/120]
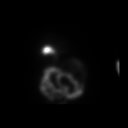
[frame 51/120]
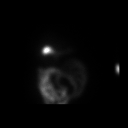
[frame 71/120]
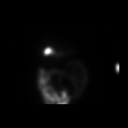
[frame 91/120]
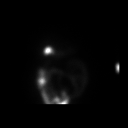
[frame 111/120]
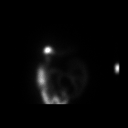

[12 of 12 positions shown; findings below may reference images not displayed]

FINDINGS: Prompt uptake of radiopharmaceutical from the blood pool. Biliary
activity and bowel activity visible by 6 minutes. Gallbladder
activity by 16 minutes.

Following oral administration of Ensure meal, calculated gallbladder
ejection fraction is 46%. (Normal gallbladder ejection fraction with
Ensure is greater than 33%.)
IMPRESSION: 1. Normal hepatobiliary scan, including a normal gallbladder
ejection fraction of 46%.

## 2017-10-27 ENCOUNTER — Emergency Department
Admission: EM | Admit: 2017-10-27 | Discharge: 2017-10-27 | Disposition: A | Discharge status: Home / Self Care | Payer: Worker's Compensation | Attending: Emergency Medicine | Admitting: Emergency Medicine

## 2017-10-27 ENCOUNTER — Other Ambulatory Visit: Payer: Self-pay

## 2017-10-27 ENCOUNTER — Encounter: Payer: Self-pay | Admitting: Emergency Medicine

## 2017-10-27 DIAGNOSIS — Z79899 Other long term (current) drug therapy: Secondary | ICD-10-CM | POA: Insufficient documentation

## 2017-10-27 DIAGNOSIS — E039 Hypothyroidism, unspecified: Secondary | ICD-10-CM | POA: Diagnosis not present

## 2017-10-27 DIAGNOSIS — Z7721 Contact with and (suspected) exposure to potentially hazardous body fluids: Secondary | ICD-10-CM | POA: Diagnosis present

## 2017-10-27 NOTE — Discharge Instructions (Signed)
[facility]
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
## 2017-12-09 ENCOUNTER — Other Ambulatory Visit: Payer: Self-pay | Admitting: Nurse Practitioner

## 2017-12-09 DIAGNOSIS — R1032 Left lower quadrant pain: Principal | ICD-10-CM

## 2017-12-09 DIAGNOSIS — R1031 Right lower quadrant pain: Secondary | ICD-10-CM

## 2017-12-09 DIAGNOSIS — K625 Hemorrhage of anus and rectum: Secondary | ICD-10-CM

## 2017-12-21 ENCOUNTER — Ambulatory Visit: Payer: Managed Care, Other (non HMO)

## 2021-05-07 ENCOUNTER — Other Ambulatory Visit: Payer: Self-pay

## 2021-05-07 ENCOUNTER — Ambulatory Visit: Payer: BC Managed Care – PPO | Admitting: Family Medicine

## 2021-05-07 ENCOUNTER — Encounter: Payer: Self-pay | Admitting: Family Medicine

## 2021-05-07 VITALS — BP 120/56 | HR 74 | Ht 66.0 in | Wt 302.6 lb

## 2021-05-07 DIAGNOSIS — E039 Hypothyroidism, unspecified: Secondary | ICD-10-CM

## 2021-05-07 DIAGNOSIS — Z7689 Persons encountering health services in other specified circumstances: Secondary | ICD-10-CM

## 2021-05-07 DIAGNOSIS — Z6841 Body Mass Index (BMI) 40.0 and over, adult: Secondary | ICD-10-CM

## 2021-05-07 MED ORDER — LEVOTHYROXINE SODIUM 75 MCG PO TABS: 75.0000 ug | ORAL_TABLET | Freq: Every day | ORAL | 2 refills | Status: DC

## 2021-05-07 NOTE — Progress Notes (Signed)
Subjective:    Patient ID: Anne Henderson, female    DOB: 1991-10-13, 30 y.o.   MRN: [id number]  Anne Henderson is a 30 y.o. female presenting on 05/07/2021 for Establish Care  Here to establish with new PCP. Moved to [place] in 2016. Now has insurance coverage and here to return to care.  HPI  Hypothyroidism Morbid Obesity BMI >48 Chronic problem for years, identified on blood work in past. She has been managed on levothyroxine for years, she had been on Levothyroxine last dose 3 years ago approx. Followed by Dr Gershon [facility] Endocrinology, last lab 2018-2019. Has come off Levothyroxine due to cost without insurance coverage, now here to get back on track.  - Describes symptoms with some reduced energy, and weight gain, persistant abnormal despite lifestyle interventions  Previously dx with insulin resistant PCOS, she was on metformin for about 3 years previously, had come off of this for past 3 years since then diagnosis was proven to not be accurate, and not consistent with PCOS after further evaluation by GYN  History of PUD / Ulcers Had seen GI in past. Had endoscopy  Previously worked as Radiation protection practitioner. Previously more active. Now temporarily working in office, more sedentary. She is going back to school online for business admin. She is active regularly with yardwork and walking.   Health Maintenance: Last pap smear >3 years, [facility]. Will return in future.  Depression screen PHQ 2/9 05/07/2021  Decreased Interest 0  Down, Depressed, Hopeless 0  PHQ - 2 Score 0    Past Medical History:  Diagnosis Date   Gallbladder disease    Hypothyroidism    PCOS (polycystic ovarian syndrome)    Past Surgical History:  Procedure Laterality Date   COLONOSCOPY WITH PROPOFOL N/A 11/12/2016   Procedure: COLONOSCOPY WITH PROPOFOL;  Surgeon: Scot Jun, MD;  Location[facility] ENDOSCOPY;  Service: Endoscopy;  Laterality: N/A;   ESOPHAGOGASTRODUODENOSCOPY (EGD) WITH PROPOFOL N/A 11/12/2016    Procedure: ESOPHAGOGASTRODUODENOSCOPY (EGD) WITH PROPOFOL;  Surgeon: Scot Jun, MD;  Location: [facility] ENDOSCOPY;  Service: Endoscopy;  Laterality: N/A;   Social History   Socioeconomic History   Marital status: Single    Spouse name: Not on file   Number of children: Not on file   Years of education: Not on file   Highest education level: Not on file  Occupational History   Not on file  Tobacco Use   Smoking status: Never   Smokeless tobacco: Never  Substance and Sexual Activity   Alcohol use: Yes    Alcohol/week: 4.0 standard drinks    Types: 4 Cans of beer per week   Drug use: Not on file   Sexual activity: Not on file  Other Topics Concern   Not on file  Social History Narrative   Not on file   Social Determinants of Health   Financial Resource Strain: Not on file  Food Insecurity: Not on file  Transportation Needs: Not on file  Physical Activity: Not on file  Stress: Not on file  Social Connections: Not on file  Intimate Partner Violence: Not on file   Family History  Problem Relation Age of Onset   Diabetes Father    Prostate cancer Father    Kidney cancer Maternal Grandmother    Colon cancer Paternal Grandmother    Stroke Paternal Grandfather    No current outpatient medications on file prior to visit.   No current facility-administered medications on file prior to visit.    Review  of Systems Per HPI unless specifically indicated above     Objective:    BP (!) 120/56   Pulse 74   Ht 5\' 6"  (1.676 m)   Wt (!) 302 lb 9.6 oz (137.3 kg)   SpO2 100%   BMI 48.84 kg/m   Wt Readings from Last 3 Encounters:  05/07/21 (!) 302 lb 9.6 oz (137.3 kg)  10/27/17 230 lb (104.3 kg)  11/12/16 250 lb (113.4 kg)    Physical Exam Vitals and nursing note reviewed.  Constitutional:      General: She is not in acute distress.    Appearance: She is well-developed. She is obese. She is not diaphoretic.     Comments: Well-appearing, comfortable, cooperative   HENT:     Head: Normocephalic and atraumatic.  Eyes:     General:        Right eye: No discharge.        Left eye: No discharge.     Conjunctiva/sclera: Conjunctivae normal.  Cardiovascular:     Rate and Rhythm: Normal rate.  Pulmonary:     Effort: Pulmonary effort is normal.  Skin:    General: Skin is warm and dry.     Findings: No erythema or rash.  Neurological:     Mental Status: She is alert and oriented to person, place, and time.  Psychiatric:        Behavior: Behavior normal.     Comments: Well groomed, good eye contact, normal speech and thoughts   Results for orders placed or performed during the hospital encounter of 11/12/16  Pregnancy, urine POC  Result Value Ref Range   Preg Test, Ur NEGATIVE NEGATIVE  Surgical pathology  Result Value Ref Range   SURGICAL PATHOLOGY      Surgical Pathology CASE: [id number] PATIENT: Quintina Bohman Surgical Pathology Report     SPECIMEN SUBMITTED: A. Duodenum; cbx B. Stomach, antrum and body; cbx C. Colon, ascending; cbx D. Colon, transverse; cbx E. Colon, descending; cbx F. Colon, sigmoid; cbx  CLINICAL HISTORY: None provided  PRE-OPERATIVE DIAGNOSIS: Chronic diarrhea, daily nausea  POST-OPERATIVE DIAGNOSIS: Gastric erosions     DIAGNOSIS: A. DUODENUM; COLD BIOPSY: - UNREMARKABLE DUODENAL MUCOSA.  B. STOMACH, ANTRUM AND BODY; COLD BIOPSY: - ANTRAL MUCOSA WITH FOCAL HEALING EROSIVE GASTRITIS. - NEGATIVE FOR H. PYLORI, DYSPLASIA, AND MALIGNANCY.  C. COLON, ASCENDING; COLD BIOPSY: - UNREMARKABLE COLONIC MUCOSA.  D. COLON, TRANSVERSE; COLD BIOPSY: - UNREMARKABLE COLONIC MUCOSA.  E. COLON, DESCENDING; COLD BIOPSY: - UNREMARKABLE COLONIC MUCOSA.  F. COLON, SIGMOID; COLD BIOPSY: - UNREMARKABLE COLONIC MUCOSA.   GROSS DESCRIPTION: A. Labeled: Cold biopsy duodenum  Tissue Fra gments: Multiple  Measurement: 0.2-0.5 cm  Comment: Pink-red tissue fragments  Entirely submitted in cassette(s):   1  B. Labeled: Cold biopsy antrum and body of stomach  Tissue Fragments: 2  Measurement: 0.3-0.4 cm  Comment: Pink-red tissue fragments  Entirely submitted in cassette(s):  1  C. Labeled: Cold biopsy ascending colon  Tissue Fragments: 3  Measurement: 0.1-0.7 cm  Comment: Tan tissue fragments  Entirely submitted in cassette(s):  1  D. Labeled: Cold biopsy transverse coon  Tissue Fragments: 1  Measurement: 0.5 cm  Comment: Tan tissue fragments  Entirely submitted in cassette(s):  1  E. Labeled: Cold biopsy descending colon  Tissue fragment(s): 1  Size: 0.4 cm  Description: Pink red tissue fragment  Entirely submitted in one cassette(s).   F. Labeled: Cold biopsy sigmoid colon  Tissue fragment(s): 2  Size: 0.2-0.4 cm  Description: Pink  red tissue fragments  Entirely submitted in one cassette(s).   Final Diagnosis per formed by Elijah Birk, MD.  Electronically signed 11/15/2016 12:34:49PM    The electronic signature indicates that the named Attending Pathologist has evaluated the specimen  Technical component performed at [facility], [address] [place], [place] [address] Lab: [phone] Dir: Titus Dubin. Cato Mulligan, MD  Professional component performed at [facility], [address] [place], [place], [place] [address] Lab: [phone] Dir: Georgiann Cocker. Oneita Kras, MD        Assessment & Plan:   Problem List Items Addressed This Visit     Morbid obesity with BMI of 45.0-49.9, adult (HCC)   Acquired hypothyroidism - Primary   Relevant Medications   levothyroxine (SYNTHROID) 75 MCG tablet   Other Relevant Orders   TSH   T4, free   Other Visit Diagnoses     Encounter to establish care with new doctor           Review prior records from PCP / Specialists on CareEverywhere.  Hypothyroidism Chronic problem, currently uncontrolled No recent lab. Will restart Levothyroxine daily, based on prior lab results and history  and active symptoms with weight gain / energy reduced - New rx sent 30 day, with refills - Return for fasting lab only 6 weeks for TSH + T4, adjust medication accordingly or change to 90 day at that time. - Anticipate to manage her hypothyroid without endocrinology at this present time  Morbid Obesity BMI >48 Likely related to hypothyroidism uncontrolled Keep improving lifestyle as planned, correct thyroid  Anticipate physical w/ rest of blood panel in approx 3 months after thyroid lab.  Orders Placed This Encounter  Procedures   TSH    Standing Status:   Future    Standing Expiration Date:   07/30/2021   T4, free    Standing Status:   Future    Standing Expiration Date:   07/30/2021     Meds ordered this encounter  Medications   levothyroxine (SYNTHROID) 75 MCG tablet    Sig: Take 1 tablet (75 mcg total) by mouth daily before breakfast.    Dispense:  30 tablet    Refill:  2       Follow up plan: Return in about 6 weeks (around 06/18/2021) for 6 week fasting lab only - no office visit yet, will do physical in 3 months after that when ready.  Saralyn Pilar, DO [facility] 05/07/2021, 10:17 AM

## 2021-05-07 NOTE — Patient Instructions (Addendum)
Thank you for coming to the office today.  Ordered Levothyroxine generic once daily in AM before breakfast 15-30 min, no coffee or other food or medicines. Water only.  30 day supply with 2 refills  Once we get blood in 6 weeks, if all is well, will re order for 90 days.  Plan on Annual PHysical / Wellness and other routine blood in 3 months AFTER the 6 wk lab.  If concerns we can schedule a visit to discuss more or virtual if no concerns, no visit other than the Annual  DUE for FASTING BLOOD WORK (no food or drink after midnight before the lab appointment, only water or coffee without cream/sugar on the morning of)  SCHEDULE "Lab Only" visit in the morning at the clinic for lab draw in 6 WEEKS   - Make sure Lab Only appointment is at about 1 week before your next appointment, so that results will be available  For Lab Results, once available within 2-3 days of blood draw, you can can log in to MyChart online to view your results and a brief explanation. Also, we can discuss results at next follow-up visit.     Please schedule a Follow-up Appointment to: Return in about 6 weeks (around 06/18/2021) for 6 week fasting lab only - no office visit yet, will do physical in 3 months after that when ready.  If you have any other questions or concerns, please feel free to call the office or send a message through MyChart. You may also schedule an earlier appointment if necessary.  Additionally, you may be receiving a survey about your experience at our office within a few days to 1 week by e-mail or mail. We value your feedback.  Saralyn Pilar, DO [facility] [place]

## 2021-06-18 ENCOUNTER — Other Ambulatory Visit: Payer: Self-pay

## 2021-06-18 ENCOUNTER — Other Ambulatory Visit: Payer: BC Managed Care – PPO

## 2021-06-18 DIAGNOSIS — E039 Hypothyroidism, unspecified: Secondary | ICD-10-CM

## 2021-06-19 ENCOUNTER — Encounter: Payer: Self-pay | Admitting: Family Medicine

## 2021-06-19 DIAGNOSIS — E039 Hypothyroidism, unspecified: Secondary | ICD-10-CM

## 2021-06-19 LAB — TSH: TSH: 2.99 mIU/L

## 2021-06-19 LAB — T4, FREE: Free T4: 1.3 ng/dL (ref 0.8–1.8)

## 2021-06-19 MED ORDER — LEVOTHYROXINE SODIUM 75 MCG PO TABS: 75.0000 ug | ORAL_TABLET | Freq: Every day | ORAL | 3 refills | Status: DC

## 2021-06-19 NOTE — Addendum Note (Signed)
Addended by: Smitty Cords on: 06/19/2021 04:47 PM   Modules accepted: Orders

## 2021-07-28 ENCOUNTER — Encounter: Payer: Self-pay | Admitting: Family Medicine

## 2021-07-28 DIAGNOSIS — B001 Herpesviral vesicular dermatitis: Secondary | ICD-10-CM

## 2021-08-26 MED ORDER — VALACYCLOVIR HCL 1 G PO TABS: 1000.0000 mg | ORAL_TABLET | Freq: Two times a day (BID) | ORAL | 2 refills | Status: AC

## 2021-08-26 NOTE — Addendum Note (Signed)
Addended by: Smitty Cords on: 08/26/2021 05:53 PM   Modules accepted: Orders

## 2021-08-28 ENCOUNTER — Telehealth: Payer: BC Managed Care – PPO | Admitting: Physician Assistant

## 2021-08-28 ENCOUNTER — Ambulatory Visit: Payer: Self-pay | Admitting: *Deleted

## 2021-08-28 DIAGNOSIS — J208 Acute bronchitis due to other specified organisms: Secondary | ICD-10-CM | POA: Diagnosis not present

## 2021-08-28 MED ORDER — ALBUTEROL SULFATE HFA 108 (90 BASE) MCG/ACT IN AERS: 2.0000 | INHALATION_SPRAY | Freq: Four times a day (QID) | RESPIRATORY_TRACT | 0 refills | Status: DC | PRN

## 2021-08-28 MED ORDER — PREDNISONE 10 MG PO TABS: 10.0000 mg | ORAL_TABLET | Freq: Every day | ORAL | 0 refills | Status: DC

## 2021-08-28 MED ORDER — BENZONATATE 100 MG PO CAPS: 100.0000 mg | ORAL_CAPSULE | Freq: Three times a day (TID) | ORAL | 0 refills | Status: DC | PRN

## 2021-08-28 MED ORDER — PROMETHAZINE-DM 6.25-15 MG/5ML PO SYRP: 5.0000 mL | ORAL_SOLUTION | Freq: Every evening | ORAL | 0 refills | Status: DC | PRN

## 2021-08-28 NOTE — Telephone Encounter (Signed)
Patient calling to report she has had cough/chest congestion for 10 days- she is using OTC Mucinex-but her symptoms are not getting better. Patient did do COVID home test last Friday- negative result. Patient advised no open appointment in office- UC virtual visit- she agrees with advise  Reason for Disposition  [1] MILD difficulty breathing (e.g., minimal/no SOB at rest, SOB with walking, pulse <100) AND [2] still present when not coughing  Answer Assessment - Initial Assessment Questions 1. ONSET: "When did the cough begin?"      10 days ago 2. SEVERITY: "How bad is the cough today?"      Coughs when talks 3. SPUTUM: "Describe the color of your sputum" (none, dry cough; clear, white, yellow, green)     Whitish- 3 days ago 4. HEMOPTYSIS: "Are you coughing up any blood?" If so ask: "How much?" (flecks, streaks, tablespoons, etc.)     no 5. DIFFICULTY BREATHING: "Are you having difficulty breathing?" If Yes, ask: "How bad is it?" (e.g., mild, moderate, severe)    - MILD: No SOB at rest, mild SOB with walking, speaks normally in sentences, can lie down, no retractions, pulse < 100.    - MODERATE: SOB at rest, SOB with minimal exertion and prefers to sit, cannot lie down flat, speaks in phrases, mild retractions, audible wheezing, pulse 100-120.    - SEVERE: Very SOB at rest, speaks in single words, struggling to breathe, sitting hunched forward, retractions, pulse > 120      Mild- chest is congested 6. FEVER: "Do you have a fever?" If Yes, ask: "What is your temperature, how was it measured, and when did it start?"     no 7. CARDIAC HISTORY: "Do you have any history of heart disease?" (e.g., heart attack, congestive heart failure)      no 8. LUNG HISTORY: "Do you have any history of lung disease?"  (e.g., pulmonary embolus, asthma, emphysema)     no 9. PE RISK FACTORS: "Do you have a history of blood clots?" (or: recent major surgery, recent prolonged travel, bedridden)     no 10. OTHER  SYMPTOMS: "Do you have any other symptoms?" (e.g., runny nose, wheezing, chest pain)       no 11. PREGNANCY: "Is there any chance you are pregnant?" "When was your last menstrual period?"       No- LMP-end of August 12. TRAVEL: "Have you traveled out of the country in the last month?" (e.g., travel history, exposures)       no  Protocols used: Cough - Acute Productive-A-AH

## 2021-08-28 NOTE — Progress Notes (Addendum)
Virtual Visit via Video Note  I connected with Anne Henderson on 08/28/21 at 12:15 PM EDT by a video enabled telemedicine application and verified that I am speaking with the correct person using two identifiers.  Location: Patient: Patient's sitting in a vehicle Provider: Provider's office  Person participating in the virtual visit: Patient and provider    I discussed the limitations of evaluation and management by telemedicine and the availability of in person appointments. The patient expressed understanding and agreed to proceed.  I discussed the assessment and treatment plan with the patient. The patient was provided an opportunity to ask questions and all were answered. The patient agreed with the plan and demonstrated an understanding of the instructions.   The patient was advised to call back or seek an in-person evaluation if the symptoms worsen or if the condition fails to improve as anticipated.  I provided 15 minutes of non-face-to-face time during this encounter.   Anne Lapping, PA-C   Acute Office Visit  Subjective:    Patient ID: Anne Henderson, female    DOB: May 06, 1991, 30 y.o.   MRN: [id number]  No chief complaint on file.   30 yo F in NAD connects via video complains for cough and nasal congestion x 10 days. Cough is productive of clear phlem, comes in spells, and is worse at night.  Has tried Mucinex and Zyrtec. Tested negative for Covid 19 at home approx 1 week ago. Denies any runny nose, sneezing, sinus pressure/pain, or headache. Denies any hx of cardiopulm disease. Denies any known exposure to someone with Covid 19 or other illness.  Cough Pertinent negatives include no chest pain, chills, ear congestion, ear pain, fever, headaches, heartburn, hemoptysis, myalgias, nasal congestion, postnasal drip, rash, rhinorrhea, sore throat, shortness of breath, sweats, weight loss or wheezing. Her past medical history is significant for environmental allergies. There is  no history of asthma, bronchiectasis, bronchitis, COPD, emphysema or pneumonia.  Patient is in today for cough x 10 days  Past Medical History:  Diagnosis Date   Gallbladder disease    Hypothyroidism    PCOS (polycystic ovarian syndrome)     Past Surgical History:  Procedure Laterality Date   COLONOSCOPY WITH PROPOFOL N/A 11/12/2016   Procedure: COLONOSCOPY WITH PROPOFOL;  Surgeon: Scot Jun, MD;  Location: [facility] ENDOSCOPY;  Service: Endoscopy;  Laterality: N/A;   ESOPHAGOGASTRODUODENOSCOPY (EGD) WITH PROPOFOL N/A 11/12/2016   Procedure: ESOPHAGOGASTRODUODENOSCOPY (EGD) WITH PROPOFOL;  Surgeon: Scot Jun, MD;  Location: [facility] ENDOSCOPY;  Service: Endoscopy;  Laterality: N/A;    Family History  Problem Relation Age of Onset   Diabetes Father    Prostate cancer Father    Kidney cancer Maternal Grandmother    Colon cancer Paternal Grandmother    Stroke Paternal Grandfather     Social History   Socioeconomic History   Marital status: Single    Spouse name: Not on file   Number of children: Not on file   Years of education: Not on file   Highest education level: Not on file  Occupational History   Not on file  Tobacco Use   Smoking status: Never   Smokeless tobacco: Never  Substance and Sexual Activity   Alcohol use: Yes    Alcohol/week: 4.0 standard drinks    Types: 4 Cans of beer per week   Drug use: Not on file   Sexual activity: Not on file  Other Topics Concern   Not on file  Social History Narrative   Not  on file   Social Determinants of Health   Financial Resource Strain: Not on file  Food Insecurity: Not on file  Transportation Needs: Not on file  Physical Activity: Not on file  Stress: Not on file  Social Connections: Not on file  Intimate Partner Violence: Not on file    Outpatient Medications Prior to Visit  Medication Sig Dispense Refill   levothyroxine (SYNTHROID) 75 MCG tablet Take 1 tablet (75 mcg total) by mouth daily before  breakfast. 90 tablet 3   valACYclovir (VALTREX) 1000 MG tablet Take 1 tablet (1,000 mg total) by mouth 2 (two) times daily. For 7-10 days as needed for flare cold sore 20 tablet 2   No facility-administered medications prior to visit.    No Known Allergies  Review of Systems  Constitutional:  Negative for chills, fever and weight loss.  HENT:  Negative for ear pain, postnasal drip, rhinorrhea and sore throat.   Respiratory:  Positive for cough. Negative for hemoptysis, shortness of breath and wheezing.   Cardiovascular:  Negative for chest pain.  Gastrointestinal:  Negative for heartburn.  Musculoskeletal:  Negative for myalgias.  Skin:  Negative for rash.  Allergic/Immunologic: Positive for environmental allergies.  Neurological:  Negative for headaches.      Objective:    Physical Exam Constitutional:      General: She is not in acute distress.    Appearance: Normal appearance. She is not ill-appearing, toxic-appearing or diaphoretic.     Comments: Appears normal, talks and walks without any distress  HENT:     Nose:     Comments: Advised pt to feel on her sinuses on video- she did without any tenderness Pulmonary:     Effort: No respiratory distress.  Neurological:     Mental Status: She is alert and oriented to person, place, and time.  Psychiatric:        Mood and Affect: Mood normal.        Behavior: Behavior normal.        Thought Content: Thought content normal.        Judgment: Judgment normal.    There were no vitals taken for this visit. Wt Readings from Last 3 Encounters:  05/07/21 (!) 302 lb 9.6 oz (137.3 kg)  10/27/17 230 lb (104.3 kg)  11/12/16 250 lb (113.4 kg)    Health Maintenance Due  Topic Date Due   COVID-19 Vaccine (1) Never done   HIV Screening  Never done   Hepatitis C Screening  Never done   PAP-Cervical Cytology Screening  Never done   PAP SMEAR-Modifier  Never done   INFLUENZA VACCINE  06/29/2021    There are no preventive care  reminders to display for this patient.   Lab Results  Component Value Date   TSH 2.99 06/18/2021   Lab Results  Component Value Date   WBC 6.8 07/30/2016   HGB 12.6 07/30/2016   HCT 37.7 07/30/2016   MCV 83 07/30/2016   PLT 301 07/30/2016   Lab Results  Component Value Date   NA 139 07/30/2016   K 4.3 07/30/2016   CO2 24 02/10/2016   GLUCOSE 99 07/30/2016   BUN 10 07/30/2016   CREATININE 0.78 07/30/2016   BILITOT <0.2 07/30/2016   ALKPHOS 68 07/30/2016   AST 15 07/30/2016   ALT 14 07/30/2016   PROT 6.9 07/30/2016   ALBUMIN 4.4 07/30/2016   CALCIUM 9.1 07/30/2016   ANIONGAP 6 02/10/2016   Lab Results  Component Value Date  CHOL 186 07/30/2016   Lab Results  Component Value Date   HDL 46 07/30/2016   Lab Results  Component Value Date   LDLCALC 125 (H) 07/30/2016   Lab Results  Component Value Date   TRIG 74 07/30/2016   Lab Results  Component Value Date   CHOLHDL 4.0 07/30/2016   Lab Results  Component Value Date   HGBA1C 5.6 07/30/2016       Assessment & Plan:   Problem List Items Addressed This Visit   None Visit Diagnoses     Acute bronchitis due to other specified organisms    -  Primary        Meds ordered this encounter  Medications   albuterol (VENTOLIN HFA) 108 (90 Base) MCG/ACT inhaler    Sig: Inhale 2 puffs into the lungs every 6 (six) hours as needed for wheezing or shortness of breath.    Dispense:  8 g    Refill:  0    Order Specific Question:   Supervising Provider    Answer:   Anne Henderson Philomena.Del   benzonatate (TESSALON) 100 MG capsule    Sig: Take 1 capsule (100 mg total) by mouth 3 (three) times daily as needed for cough.    Dispense:  20 capsule    Refill:  0    Order Specific Question:   Supervising Provider    Answer:   Anne Henderson [[id number]]   predniSONE (DELTASONE) 10 MG tablet    Sig: Take 1 tablet (10 mg total) by mouth daily with breakfast. Take 6 pill on day 1, decrease by one pill daily until finished     Dispense:  21 tablet    Refill:  0    Order Specific Question:   Supervising Provider    Answer:   Anne Henderson Philomena.Del   promethazine-dextromethorphan (PROMETHAZINE-DM) 6.25-15 MG/5ML syrup    Sig: Take 5 mLs by mouth at bedtime as needed for cough.    Dispense:  118 mL    Refill:  0    Order Specific Question:   Supervising Provider    Answer:   Anne Henderson [phone]    Advised pt to get tested for Covid 19.  Advised pt if symptoms continue, or if he develops any new symptoms, to submit a video visit or to have a face to face visit for further evaluation. Any worsening, please go to an urgent care or the ER.   Anne Lapping, PA-C

## 2021-12-01 ENCOUNTER — Encounter: Payer: Self-pay | Admitting: Family Medicine

## 2021-12-01 ENCOUNTER — Telehealth: Payer: BC Managed Care – PPO | Admitting: Nurse Practitioner

## 2021-12-01 DIAGNOSIS — J40 Bronchitis, not specified as acute or chronic: Secondary | ICD-10-CM

## 2021-12-01 MED ORDER — AZITHROMYCIN 250 MG PO TABS: ORAL_TABLET | ORAL | 0 refills | Status: AC

## 2021-12-01 MED ORDER — PROAIR RESPICLICK 108 (90 BASE) MCG/ACT IN AEPB: 2.0000 | INHALATION_SPRAY | Freq: Four times a day (QID) | RESPIRATORY_TRACT | 0 refills | Status: DC | PRN

## 2021-12-01 NOTE — Progress Notes (Signed)
Virtual Visit Consent   Anne Henderson, you are scheduled for a virtual visit with a [facility] provider today.     Just as with appointments in the office, your consent must be obtained to participate.  Your consent will be active for this visit and any virtual visit you may have with one of our providers in the next 365 days.     If you have a MyChart account, a copy of this consent can be sent to you electronically.  All virtual visits are billed to your insurance company just like a traditional visit in the office.    As this is a virtual visit, video technology does not allow for your provider to perform a traditional examination.  This may limit your provider's ability to fully assess your condition.  If your provider identifies any concerns that need to be evaluated in person or the need to arrange testing (such as labs, EKG, etc.), we will make arrangements to do so.     Although advances in technology are sophisticated, we cannot ensure that it will always work on either your end or our end.  If the connection with a video visit is poor, the visit may have to be switched to a telephone visit.  With either a video or telephone visit, we are not always able to ensure that we have a secure connection.     I need to obtain your verbal consent now.   Are you willing to proceed with your visit today?    Anne Henderson has provided verbal consent on 12/01/2021 for a virtual visit (video or telephone).   Viviano Simas, FNP   Date: 12/01/2021 1:21 PM   Virtual Visit via Video Note   I, Viviano Simas, connected with  Anne Henderson  ([id number], 1991-01-06) on 12/01/21 at  2:15 PM EST by a video-enabled telemedicine application and verified that I am speaking with the correct person using two identifiers.  Location: Patient: Virtual Visit Location Patient: Home Provider: Virtual Visit Location Provider: Home Office   I discussed the limitations of evaluation and management by telemedicine and  the availability of in person appointments. The patient expressed understanding and agreed to proceed.    History of Present Illness: Anne Henderson is a 31 y.o. who identifies as a female who was assigned female at birth, and is being seen today with complaints of congestion and productive cough.   Initially had a fever for 48 hours that has now resolved   Has tested negative for flu and COVID  Denies a history of asthma Has been using Mucinex OTC for relief.   Did have some tightness yesterday in chest that has resolved denies wheezing   Has had bronchitis in the past and needed prednisone and an inhaler at that time.   Problems:  Patient Active Problem List   Diagnosis Date Noted   Acquired hypothyroidism 05/07/2021   Morbid obesity with BMI of 45.0-49.9, adult (HCC) 05/07/2021    Allergies: No Known Allergies Medications:  Current Outpatient Medications:    albuterol (VENTOLIN HFA) 108 (90 Base) MCG/ACT inhaler, Inhale 2 puffs into the lungs every 6 (six) hours as needed for wheezing or shortness of breath., Disp: 8 g, Rfl: 0   benzonatate (TESSALON) 100 MG capsule, Take 1 capsule (100 mg total) by mouth 3 (three) times daily as needed for cough., Disp: 20 capsule, Rfl: 0   levothyroxine (SYNTHROID) 75 MCG tablet, Take 1 tablet (75 mcg total) by mouth daily before breakfast., Disp:  90 tablet, Rfl: 3   predniSONE (DELTASONE) 10 MG tablet, Take 1 tablet (10 mg total) by mouth daily with breakfast. Take 6 pill on day 1, decrease by one pill daily until finished, Disp: 21 tablet, Rfl: 0   promethazine-dextromethorphan (PROMETHAZINE-DM) 6.25-15 MG/5ML syrup, Take 5 mLs by mouth at bedtime as needed for cough., Disp: 118 mL, Rfl: 0   valACYclovir (VALTREX) 1000 MG tablet, Take 1 tablet (1,000 mg total) by mouth 2 (two) times daily. For 7-10 days as needed for flare cold sore, Disp: 20 tablet, Rfl: 2  Observations/Objective: Patient is well-developed, well-nourished in no acute distress.   Resting comfortably at home.  Head is normocephalic, atraumatic.  No labored breathing.  Speech is clear and coherent with logical content.  Patient is alert and oriented at baseline.    Assessment and Plan: 1. Bronchitis Take antibiotic with food  - azithromycin (ZITHROMAX) 250 MG tablet; Take 2 tablets on day 1, then 1 tablet daily on days 2 through 5  Dispense: 6 tablet; Refill: 0 - Albuterol Sulfate (PROAIR RESPICLICK) 108 (90 Base) MCG/ACT AEPB; Inhale 2 puffs into the lungs every 6 (six) hours as needed.  Dispense: 1 each; Refill: 0    Continue Mucinex OTC to help with cough and mucous production  Push fluids and rest  Follow Up Instructions: I discussed the assessment and treatment plan with the patient. The patient was provided an opportunity to ask questions and all were answered. The patient agreed with the plan and demonstrated an understanding of the instructions.  A copy of instructions were sent to the patient via MyChart unless otherwise noted below.     The patient was advised to call back or seek an in-person evaluation if the symptoms worsen or if the condition fails to improve as anticipated.  Time:  I spent 10 minutes with the patient via telehealth technology discussing the above problems/concerns.    Viviano Simas, FNP

## 2021-12-02 ENCOUNTER — Other Ambulatory Visit: Payer: Self-pay | Admitting: Nurse Practitioner

## 2021-12-02 ENCOUNTER — Encounter: Payer: Self-pay | Admitting: Nurse Practitioner

## 2021-12-02 DIAGNOSIS — R062 Wheezing: Secondary | ICD-10-CM

## 2021-12-02 MED ORDER — ALBUTEROL SULFATE HFA 108 (90 BASE) MCG/ACT IN AERS: 2.0000 | INHALATION_SPRAY | Freq: Four times a day (QID) | RESPIRATORY_TRACT | 0 refills | Status: DC | PRN

## 2021-12-30 ENCOUNTER — Other Ambulatory Visit: Payer: Self-pay

## 2021-12-30 ENCOUNTER — Encounter: Payer: Self-pay | Admitting: Family Medicine

## 2021-12-30 ENCOUNTER — Ambulatory Visit: Payer: BC Managed Care – PPO | Admitting: Family Medicine

## 2021-12-30 ENCOUNTER — Ambulatory Visit: Payer: Self-pay

## 2021-12-30 VITALS — BP 145/80 | HR 78 | Ht 66.0 in | Wt 284.4 lb

## 2021-12-30 DIAGNOSIS — H1032 Unspecified acute conjunctivitis, left eye: Secondary | ICD-10-CM

## 2021-12-30 MED ORDER — POLYMYXIN B-TRIMETHOPRIM 10000-0.1 UNIT/ML-% OP SOLN: 1.0000 [drp] | OPHTHALMIC | 0 refills | Status: DC

## 2021-12-30 NOTE — Progress Notes (Signed)
Subjective:    Patient ID: Anne Henderson, female    DOB: 05-Jan-1991, 31 y.o.   MRN: [id number]  Anne Henderson is a 31 y.o. female presenting on 12/30/2021 for Eye Drainage and Eye Pain  Patient presents for a same day appointment.   HPI  Acute L eye conjunctivitis Reports acute onset 1 day L eye inner aspect mostly redness irritation discomfort with drainage discharge when woke up was crusty. Using warm compress some relief. No sick contacts except relatives in daycare. Denies any fever chills body ache headache sinus drainage congestion cough Denies any loss of vision, limitation in vision or eye movement, redness or swelling on face  Depression screen [facility] 2/9 05/07/2021  Decreased Interest 0  Down, Depressed, Hopeless 0  PHQ - 2 Score 0    Social History   Tobacco Use   Smoking status: Never   Smokeless tobacco: Never  Substance Use Topics   Alcohol use: Yes    Alcohol/week: 4.0 standard drinks    Types: 4 Cans of beer per week    Review of Systems Per HPI unless specifically indicated above     Objective:    BP (!) 145/80    Pulse 78    Ht 5\' 6"  (1.676 m)    Wt 284 lb 6.4 oz (129 kg)    SpO2 100%    BMI 45.90 kg/m   Wt Readings from Last 3 Encounters:  12/30/21 284 lb 6.4 oz (129 kg)  05/07/21 (!) 302 lb 9.6 oz (137.3 kg)  10/27/17 230 lb (104.3 kg)    Physical Exam Vitals and nursing note reviewed.  Constitutional:      General: She is not in acute distress.    Appearance: Normal appearance. She is well-developed. She is not diaphoretic.     Comments: Well-appearing, comfortable, cooperative  HENT:     Head: Normocephalic and atraumatic.  Eyes:     General:        Right eye: No discharge.        Left eye: No discharge.     Extraocular Movements: Extraocular movements intact.     Pupils: Pupils are equal, round, and reactive to light.     Comments: Left eye with mild conjunctival injection mostly inner aspect, some evidence of residual drainage crust but  mostly clear now at this time.  Cardiovascular:     Rate and Rhythm: Normal rate.  Pulmonary:     Effort: Pulmonary effort is normal.  Skin:    General: Skin is warm and dry.     Findings: No erythema or rash.  Neurological:     Mental Status: She is alert and oriented to person, place, and time.  Psychiatric:        Mood and Affect: Mood normal.        Behavior: Behavior normal.        Thought Content: Thought content normal.     Comments: Well groomed, good eye contact, normal speech and thoughts   Results for orders placed or performed in visit on 06/18/21  T4, free  Result Value Ref Range   Free T4 1.3 0.8 - 1.8 ng/dL  TSH  Result Value Ref Range   TSH 2.99 mIU/L      Assessment & Plan:   Problem List Items Addressed This Visit   None Visit Diagnoses     Acute conjunctivitis of left eye, unspecified acute conjunctivitis type    -  Primary   Relevant Medications   trimethoprim-polymyxin  b (POLYTRIM) ophthalmic solution       Acute L conjunctivitis for past 1 day, with mild scleral/conjunctival injection without discharge currently has resolved from this AM. Suspected viral vs bacterial etiology No known sick contact or URI symptoms - Normal visual acuity in office - No evidence of complication, foreign body, or extending eyelid / pre-septal cellulitis  Plan: 1. Reassurance, most likely self limited, empiric coverage with Polytrim antibiotic eye drops 1 drop in L eye every 4 hours for 7-10 days 2. Start moist warm compresses over Left eyelid 10-15 min at a time, up to 4-6 times daily until resolution 3. Frequent hand washing, avoid rubbing / scratching eye 4. Strict return precautions for spreading infection 5. Follow-up 1-2 weeks as needed    Meds ordered this encounter  Medications   trimethoprim-polymyxin b (POLYTRIM) ophthalmic solution    Sig: Place 1 drop into the left eye every 4 (four) hours. For 7-10 days    Dispense:  10 mL    Refill:  0       Follow up plan: Return if symptoms worsen or fail to improve.   Nobie Putnam, DO [facility] 12/30/2021, 11:38 AM

## 2021-12-30 NOTE — Patient Instructions (Signed)
Thank you for coming to the office today.  Conjunctivitis Can be viral or bacterial  Start with antibiotic eye drops 1 drop every 4-6 hours for 7-10 days.  - Initial treatment is warm compresses up to 4 to 6 times a day using warm washcloth place over eyelid and apply gentle pressure, hold this on for 10-15 min at a time, can re-warm if cools down  It may drain pus and reduce in size, this is ideal, otherwise if significant worsening with spreading of redness or swelling onto eyelid or around face, unable to see, fevers/chills, then please return to clinic sooner or call, may go to Emergency Dept if significant worsening   Do not wear any contacts until this heals  Please schedule a Follow-up Appointment to: No follow-ups on file.  If you have any other questions or concerns, please feel free to call the office or send a message through MyChart. You may also schedule an earlier appointment if necessary.  Additionally, you may be receiving a survey about your experience at our office within a few days to 1 week by e-mail or mail. We value your feedback.  Saralyn Pilar, DO [facility], [place]

## 2021-12-30 NOTE — Telephone Encounter (Signed)
° °  Chief Complaint: Painful left eye Symptoms: Red, draining Frequency: Started yesterday Pertinent Negatives: Patient denies fever Disposition: [] ED /[] Urgent Care (no appt availability in office) / [x] Appointment(In office/virtual)/ []  [facility]/ [] Home Care/ [] Refused Recommended Disposition /[] [facility]/ []  Follow-up with PCP Additional Notes:    Reason for Disposition  Yellow or green pus occurs  Answer Assessment - Initial Assessment Questions 1. ONSET: "When did the pain start?" (e.g., minutes, hours, days)     Yesterday 2. TIMING: "Does the pain come and go, or has it been constant since it started?" (e.g., constant, intermittent, fleeting)     Comes and goes 3. SEVERITY: "How bad is the pain?"   (Scale 1-10; mild, moderate or severe)   - MILD (1-3): doesn't interfere with normal activities    - MODERATE (4-7): interferes with normal activities or awakens from sleep    - SEVERE (8-10): excruciating pain and patient unable to do normal activities     2 4. LOCATION: "Where does it hurt?"  (e.g., eyelid, eye, cheekbone)     Left eye 5. CAUSE: "What do you think is causing the pain?"     Unsure 6. VISION: "Do you have blurred vision or changes in your vision?"      No 7. EYE DISCHARGE: "Is there any discharge (pus) from the eye(s)?"  If yes, ask: "What color is it?"      Yes- greenish 8. FEVER: "Do you have a fever?" If Yes, ask: "What is it, how was it measured, and when did it start?"      No 9. OTHER SYMPTOMS: "Do you have any other symptoms?" (e.g., headache, nasal discharge, facial rash)     No 10. PREGNANCY: "Is there any chance you are pregnant?" "When was your last menstrual period?"       No  Protocols used: Eye Pain and Other Symptoms-A-AH

## 2021-12-31 ENCOUNTER — Telehealth: Payer: BC Managed Care – PPO | Admitting: Physician Assistant

## 2021-12-31 DIAGNOSIS — J019 Acute sinusitis, unspecified: Secondary | ICD-10-CM

## 2021-12-31 DIAGNOSIS — B9789 Other viral agents as the cause of diseases classified elsewhere: Secondary | ICD-10-CM

## 2021-12-31 MED ORDER — FEXOFENADINE-PSEUDOEPHED ER 60-120 MG PO TB12: 1.0000 | ORAL_TABLET | Freq: Two times a day (BID) | ORAL | 0 refills | Status: DC

## 2021-12-31 MED ORDER — AZELASTINE HCL 0.1 % NA SOLN: 1.0000 | Freq: Two times a day (BID) | NASAL | 0 refills | Status: DC

## 2021-12-31 NOTE — Patient Instructions (Signed)
Anne Henderson, thank you for joining Piedad Climes, PA-C for today's virtual visit.  While this provider is not your primary care provider (PCP), if your PCP is located in our provider database this encounter information will be shared with them immediately following your visit.  Consent: (Patient) Anne Henderson provided verbal consent for this virtual visit at the beginning of the encounter.  Current Medications:  Current Outpatient Medications:    albuterol (VENTOLIN HFA) 108 (90 Base) MCG/ACT inhaler, Inhale 2 puffs into the lungs every 6 (six) hours as needed for wheezing or shortness of breath., Disp: 8 g, Rfl: 0   Albuterol Sulfate (PROAIR RESPICLICK) 108 (90 Base) MCG/ACT AEPB, Inhale 2 puffs into the lungs every 6 (six) hours as needed., Disp: 1 each, Rfl: 0   levothyroxine (SYNTHROID) 75 MCG tablet, Take 1 tablet (75 mcg total) by mouth daily before breakfast., Disp: 90 tablet, Rfl: 3   trimethoprim-polymyxin b (POLYTRIM) ophthalmic solution, Place 1 drop into the left eye every 4 (four) hours. For 7-10 days, Disp: 10 mL, Rfl: 0   valACYclovir (VALTREX) 1000 MG tablet, Take 1 tablet (1,000 mg total) by mouth 2 (two) times daily. For 7-10 days as needed for flare cold sore, Disp: 20 tablet, Rfl: 2   Medications ordered in this encounter:  No orders of the defined types were placed in this encounter.    *If you need refills on other medications prior to your next appointment, please contact your pharmacy*  Follow-Up: Call back or seek an in-person evaluation if the symptoms worsen or if the condition fails to improve as anticipated.  Other Instructions E-Visit for Sinus Problems  We are sorry that you are not feeling well.  Here is how we plan to help!  Based on what you have shared with me it looks like you have sinusitis.  Sinusitis is inflammation and infection in the sinus cavities of the head.  Based on your presentation I believe you most likely have Acute Viral  Sinusitis.This is an infection most likely caused by a virus. There is not specific treatment for viral sinusitis other than to help you with the symptoms until the infection runs its course.  Continue your plain Mucinex. Restart Flonase and use the Astelin spray I have sent in along with this as directed. Start the Allegra-D as directed.  Some authorities believe that zinc sprays or the use of Echinacea may shorten the course of your symptoms.  Sinus infections are not as easily transmitted as other respiratory infection, however we still recommend that you avoid close contact with loved ones, especially the very young and elderly.  Remember to wash your hands thoroughly throughout the day as this is the number one way to prevent the spread of infection!  Home Care: Only take medications as instructed by your medical team. Do not take these medications with alcohol. A steam or ultrasonic humidifier can help congestion.  You can place a towel over your head and breathe in the steam from hot water coming from a faucet. Avoid close contacts especially the very young and the elderly. Cover your mouth when you cough or sneeze. Always remember to wash your hands.  Get Help Right Away If: You develop worsening fever or sinus pain. You develop a severe head ache or visual changes. Your symptoms persist after you have completed your treatment plan.  Make sure you Understand these instructions. Will watch your condition. Will get help right away if you are not doing well or get  worse.    If you have been instructed to have an in-person evaluation today at a local Urgent Care facility, please use the link below. It will take you to a list of all of our available [facility], including address, phone number and hours of operation. Please do not delay care.  [facility]  If you or a family member do not have a primary care provider, use the link below to schedule a visit  and establish care. When you choose a [facility] primary care physician or advanced practice provider, you gain a long-term partner in health. Find a Primary Care Provider  Learn more about 's in-office and virtual care options:  - Get Care Now

## 2021-12-31 NOTE — Progress Notes (Signed)
Virtual Visit Consent   Anne Henderson, you are scheduled for a virtual visit with a [facility] provider today.     Just as with appointments in the office, your consent must be obtained to participate.  Your consent will be active for this visit and any virtual visit you may have with one of our providers in the next 365 days.     If you have a MyChart account, a copy of this consent can be sent to you electronically.  All virtual visits are billed to your insurance company just like a traditional visit in the office.    As this is a virtual visit, video technology does not allow for your provider to perform a traditional examination.  This may limit your provider's ability to fully assess your condition.  If your provider identifies any concerns that need to be evaluated in person or the need to arrange testing (such as labs, EKG, etc.), we will make arrangements to do so.     Although advances in technology are sophisticated, we cannot ensure that it will always work on either your end or our end.  If the connection with a video visit is poor, the visit may have to be switched to a telephone visit.  With either a video or telephone visit, we are not always able to ensure that we have a secure connection.     I need to obtain your verbal consent now.   Are you willing to proceed with your visit today?    Avaya Graboski has provided verbal consent on 12/31/2021 for a virtual visit (video or telephone).   Leeanne Rio, [place]   Date: 12/31/2021 11:10 AM   Virtual Visit via Video Note   I, Leeanne Rio, connected with  Seerit Vile  ([id number], [birth year]) on 12/31/21 at 11:00 AM EST by a video-enabled telemedicine application and verified that I am speaking with the correct person using two identifiers.  Location: Patient: Virtual Visit Location Patient: Home Provider: Virtual Visit Location Provider: Home Office   I discussed the limitations of evaluation and management by  telemedicine and the availability of in person appointments. The patient expressed understanding and agreed to proceed.    History of Present Illness: Tashala Klocko is a 31 y.o. who identifies as a female who was assigned female at birth, and is being seen today for head congestion, nasal congestion with sinus pressure and some ear pressure/pain bilaterally starting overnight. Was seen at her PCP office yesterday for left eye conjunctivitis. Was thought to potentially be either viral or bacterial, so Polytrim started to be cautious. She notes having a mild sore throat at that time but no other URI symptoms. Denies chills, aches, chest congestion. Low-grade fever overnight but resolved at present. COVD test today was negative.   HPI: HPI  Problems:  Patient Active Problem List   Diagnosis Date Noted   Acquired hypothyroidism 05/07/2021   Morbid obesity with BMI of 45.0-49.9, adult ([facility]) 05/07/2021    Allergies: No Known Allergies Medications:  Current Outpatient Medications:    azelastine (ASTELIN) 0.1 % nasal spray, Place 1 spray into both nostrils 2 (two) times daily. Use in each nostril as directed, Disp: 30 mL, Rfl: 0   fexofenadine-pseudoephedrine (ALLEGRA-D ALLERGY & CONGESTION) 60-120 MG 12 hr tablet, Take 1 tablet by mouth 2 (two) times daily., Disp: 30 tablet, Rfl: 0   albuterol (VENTOLIN HFA) 108 (90 Base) MCG/ACT inhaler, Inhale 2 puffs into the lungs every 6 (six) hours as  needed for wheezing or shortness of breath., Disp: 8 g, Rfl: 0   levothyroxine (SYNTHROID) 75 MCG tablet, Take 1 tablet (75 mcg total) by mouth daily before breakfast., Disp: 90 tablet, Rfl: 3   trimethoprim-polymyxin b (POLYTRIM) ophthalmic solution, Place 1 drop into the left eye every 4 (four) hours. For 7-10 days, Disp: 10 mL, Rfl: 0   valACYclovir (VALTREX) 1000 MG tablet, Take 1 tablet (1,000 mg total) by mouth 2 (two) times daily. For 7-10 days as needed for flare cold sore, Disp: 20 tablet, Rfl:  2  Observations/Objective: Patient is well-developed, well-nourished in no acute distress.  Resting comfortably at home.  Head is normocephalic, atraumatic.  No labored breathing. Speech is clear and coherent with logical content.  Patient is alert and oriented at baseline.   Assessment and Plan: 1. Acute viral sinusitis - azelastine (ASTELIN) 0.1 % nasal spray; Place 1 spray into both nostrils 2 (two) times daily. Use in each nostril as directed  Dispense: 30 mL; Refill: 0 - fexofenadine-pseudoephedrine (ALLEGRA-D ALLERGY & CONGESTION) 60-120 MG 12 hr tablet; Take 1 tablet by mouth 2 (two) times daily.  Dispense: 30 tablet; Refill: 0  COVID negative. No consistent higher fever, chills, aches to suggest flu-like illness but if these develop, will want her tested. Seems more like viral URI causing conjunctivitis and sinusitis. Can continue Polytrim since started already. Supportive measures, otc medications reviewed. She has Flonase at home and is to restart. Rx Astelin spray and Allegra-D to take as directed. Follow-up if not improving or any new/worsening symptoms.   Follow Up Instructions: I discussed the assessment and treatment plan with the patient. The patient was provided an opportunity to ask questions and all were answered. The patient agreed with the plan and demonstrated an understanding of the instructions.  A copy of instructions were sent to the patient via MyChart unless otherwise noted below.   The patient was advised to call back or seek an in-person evaluation if the symptoms worsen or if the condition fails to improve as anticipated.  Time:  I spent 10 minutes with the patient via telehealth technology discussing the above problems/concerns.    Leeanne Rio, PA-C

## 2022-01-01 ENCOUNTER — Other Ambulatory Visit: Payer: Self-pay | Admitting: Family Medicine

## 2022-01-01 ENCOUNTER — Encounter: Payer: Self-pay | Admitting: Family Medicine

## 2022-01-01 DIAGNOSIS — H6690 Otitis media, unspecified, unspecified ear: Secondary | ICD-10-CM

## 2022-01-01 MED ORDER — AMOXICILLIN-POT CLAVULANATE 875-125 MG PO TABS: 1.0000 | ORAL_TABLET | Freq: Two times a day (BID) | ORAL | 0 refills | Status: DC

## 2022-02-20 ENCOUNTER — Telehealth: Payer: BC Managed Care – PPO | Admitting: Family Medicine

## 2022-02-20 DIAGNOSIS — B372 Candidiasis of skin and nail: Secondary | ICD-10-CM

## 2022-02-20 MED ORDER — CLOTRIMAZOLE-BETAMETHASONE 1-0.05 % EX CREA: 1.0000 "application " | TOPICAL_CREAM | Freq: Two times a day (BID) | CUTANEOUS | 0 refills | Status: DC

## 2022-02-20 NOTE — Patient Instructions (Signed)
Intertrigo Intertrigo is skin irritation or inflammation (dermatitis) that occurs when folds of skin rub together. The irritation can cause a rash and make skin raw and itchy. This condition most commonly occurs in the skin folds of these areas: Toes. Armpits. Groin. Under the belly. Under the breasts. Buttocks. Intertrigo is not passed from person to person (is not contagious). What are the causes? This condition is caused by heat, moisture, rubbing (friction), and not enough air circulation. The condition can be made worse by: Sweat. Bacteria. A fungus, such as yeast. What increases the risk? This condition is more likely to occur if you have moisture in your skin folds. You are more likely to develop this condition if you: Have diabetes. Are overweight. Are not able to move around or are not active. Live in a warm and moist climate. Wear splints, braces, or other medical devices. Are not able to control your bowels or bladder (have incontinence). What are the signs or symptoms? Symptoms of this condition include: A pink or red skin rash in the skin fold or near the skin fold. Raw or scaly skin. Itchiness. A burning feeling. Bleeding. Leaking fluid. A bad smell. How is this diagnosed? This condition is diagnosed with a medical history and physical exam. You mayalso have a skin swab to test for bacteria or a fungus. How is this treated? This condition may be treated by: Cleaning and drying your skin. Taking an antibiotic medicine or using an antibiotic skin cream for a bacterial infection. Using an antifungal cream on your skin or taking pills for an infection that was caused by a fungus, such as yeast. Using a steroid ointment to relieve itchiness and irritation. Separating the skin fold with a clean cotton cloth to absorb moisture and allow air to flow into the area. Follow these instructions at home: Keep the affected area clean and dry. Do not scratch your skin. Stay  in a cool environment as much as possible. Use an air conditioner or fan, if available. Apply over-the-counter and prescription medicines only as told by your health care provider. If you were prescribed an antibiotic medicine, use it as told by your health care provider. Do not stop using the antibiotic even if your condition improves. Keep all follow-up visits as told by your health care provider. This is important. How is this prevented?  Maintain a healthy weight. Take care of your feet, especially if you have diabetes. Foot care includes: Wearing shoes that fit well. Keeping your feet dry. Wearing clean, breathable socks. Protect the skin around your groin and buttocks, especially if you have incontinence. Skin protection includes: Following a regular cleaning routine. Using skin protectant creams, powders, or ointments. Changing protection pads frequently. Do not wear tight clothes. Wear clothes that are loose, absorbent, and made of cotton. Wear a bra that gives good support, if needed. Shower and dry yourself well after activity or exercise. Use a hair dryer on a cool setting to dry between skin folds, especially after you bathe. If you have diabetes, keep your blood sugar under control. Contact a health care provider if: Your symptoms do not improve with treatment. Your symptoms get worse or they spread. You notice increased redness and warmth. You have a fever. Summary Intertrigo is skin irritation or inflammation (dermatitis) that occurs when folds of skin rub together. This condition is caused by heat, moisture, rubbing (friction), and not enough air circulation. This condition may be treated by cleaning and drying your skin and with medicines. Apply   over-the-counter and prescription medicines only as told by your health care provider. Keep all follow-up visits as told by your health care provider. This is important. This information is not intended to replace advice given  to you by your health care provider. Make sure you discuss any questions you have with your healthcare provider. Document Revised: 04/10/2018 Document Reviewed: 04/17/2018 Elsevier Patient Education  2022 Elsevier Inc.  

## 2022-02-20 NOTE — Progress Notes (Signed)
?Virtual Visit Consent  ? ?Anne Henderson, you are scheduled for a virtual visit with a [facility] provider today.   ?  ?Just as with appointments in the office, your consent must be obtained to participate.  Your consent will be active for this visit and any virtual visit you may have with one of our providers in the next 365 days.   ?  ?If you have a MyChart account, a copy of this consent can be sent to you electronically.  All virtual visits are billed to your insurance company just like a traditional visit in the office.   ? ?As this is a virtual visit, video technology does not allow for your provider to perform a traditional examination.  This may limit your provider's ability to fully assess your condition.  If your provider identifies any concerns that need to be evaluated in person or the need to arrange testing (such as labs, EKG, etc.), we will make arrangements to do so.   ?  ?Although advances in technology are sophisticated, we cannot ensure that it will always work on either your end or our end.  If the connection with a video visit is poor, the visit may have to be switched to a telephone visit.  With either a video or telephone visit, we are not always able to ensure that we have a secure connection.    ? ?I need to obtain your verbal consent now.   Are you willing to proceed with your visit today?  ?  ?Anne Henderson has provided verbal consent on 02/20/2022 for a virtual visit (video or telephone). ?  ?[name], FNP  ? ?Date: 02/20/2022 11:58 AM ? ? ?Virtual Visit via Video Note  ? ?IGeorgana Curio, connected with  Anne Henderson  ([id number], 1991-01-27) on 02/20/22 at 11:45 AM EDT by a video-enabled telemedicine application and verified that I am speaking with the correct person using two identifiers. ? ?Location: ?Patient: Virtual Visit Location Patient: Home ?Provider: Virtual Visit Location Provider: Home Office ?  ?I discussed the limitations of evaluation and management by telemedicine and  the availability of in person appointments. The patient expressed understanding and agreed to proceed.   ? ?History of Present Illness: ?Anne Henderson is a 31 y.o. who identifies as a female who was assigned female at birth, and is being seen today for rash under breast bilat. Pruritic red and raised. OTC antifungal not working. She started an exercise program and feels that caused rash. She has lost weight. She is not diabetic.  ? ?HPI: HPI  ?Problems:  ?Patient Active Problem List  ? Diagnosis Date Noted  ? Acquired hypothyroidism 05/07/2021  ? Morbid obesity with BMI of 45.0-49.9, adult ([facility]) 05/07/2021  ?  ?Allergies: No Known Allergies ?Medications:  ?Current Outpatient Medications:  ?  albuterol (VENTOLIN HFA) 108 (90 Base) MCG/ACT inhaler, Inhale 2 puffs into the lungs every 6 (six) hours as needed for wheezing or shortness of breath., Disp: 8 g, Rfl: 0 ?  amoxicillin-clavulanate (AUGMENTIN) 875-125 MG tablet, Take 1 tablet by mouth 2 (two) times daily., Disp: 20 tablet, Rfl: 0 ?  azelastine (ASTELIN) 0.1 % nasal spray, Place 1 spray into both nostrils 2 (two) times daily. Use in each nostril as directed, Disp: 30 mL, Rfl: 0 ?  fexofenadine-pseudoephedrine (ALLEGRA-D ALLERGY & CONGESTION) 60-120 MG 12 hr tablet, Take 1 tablet by mouth 2 (two) times daily., Disp: 30 tablet, Rfl: 0 ?  levothyroxine (SYNTHROID) 75 MCG tablet, Take 1 tablet (75  mcg total) by mouth daily before breakfast., Disp: 90 tablet, Rfl: 3 ?  trimethoprim-polymyxin b (POLYTRIM) ophthalmic solution, Place 1 drop into the left eye every 4 (four) hours. For 7-10 days, Disp: 10 mL, Rfl: 0 ?  valACYclovir (VALTREX) 1000 MG tablet, Take 1 tablet (1,000 mg total) by mouth 2 (two) times daily. For 7-10 days as needed for flare cold sore, Disp: 20 tablet, Rfl: 2 ? ?Observations/Objective: ?Patient is well-developed, well-nourished in no acute distress.  ?Resting comfortably at home.  ?Head is normocephalic, atraumatic.  ?No labored breathing.   ?Speech is clear and coherent with logical content.  ?Patient is alert and oriented at baseline.  ?Rash is raised and erythematous under breast bilat.  ? ?Assessment and Plan: ?1. Candidal intertrigo ? ?Keep areas clean and dry. Use hair dryer on cool setting. Med use and side effects discussed. F/U with pcp as needed.  ? ?Follow Up Instructions: ?I discussed the assessment and treatment plan with the patient. The patient was provided an opportunity to ask questions and all were answered. The patient agreed with the plan and demonstrated an understanding of the instructions.  A copy of instructions were sent to the patient via MyChart unless otherwise noted below.  ? ? ? ?The patient was advised to call back or seek an in-person evaluation if the symptoms worsen or if the condition fails to improve as anticipated. ? ?Time:  ?I spent 10 minutes with the patient via telehealth technology discussing the above problems/concerns.   ? ?[name], FNP ? ?

## 2022-03-18 ENCOUNTER — Other Ambulatory Visit: Payer: Self-pay

## 2022-03-18 ENCOUNTER — Encounter: Payer: Self-pay | Admitting: Family Medicine

## 2022-03-18 MED ORDER — CLOTRIMAZOLE-BETAMETHASONE 1-0.05 % EX CREA: 1.0000 "application " | TOPICAL_CREAM | Freq: Two times a day (BID) | CUTANEOUS | 0 refills | Status: AC

## 2022-11-01 ENCOUNTER — Other Ambulatory Visit: Payer: Self-pay | Admitting: Family Medicine

## 2022-11-01 DIAGNOSIS — E039 Hypothyroidism, unspecified: Secondary | ICD-10-CM

## 2022-11-02 NOTE — Telephone Encounter (Signed)
Requested medication (s) are due for refill today: Yes  Requested medication (s) are on the active medication list: Yes  Last refill:  06/19/21  Future visit scheduled: No  Notes to clinic:  Prescription expired.    Requested Prescriptions  Pending Prescriptions Disp Refills   levothyroxine (SYNTHROID) 75 MCG tablet [Pharmacy Med Name: LEVOTHYROXINE SODIUM 75 MCG TAB] 90 tablet 3    Sig: TAKE 1 TABLET BY MOUTH ONCE DAILY ON AN EMPTY STOMACH. WAIT 30 MINUTES BEFORE TAKING OTHER MEDS.     Endocrinology:  Hypothyroid Agents Failed - 11/01/2022 10:00 AM      Failed - TSH in normal range and within 360 days    TSH  Date Value Ref Range Status  06/18/2021 2.99 mIU/L Final    Comment:              Reference Range .           > or = 20 Years  0.40-4.50 .                Pregnancy Ranges           First trimester    0.26-2.66           Second trimester   0.55-2.73           Third trimester    0.43-2.91          Passed - Valid encounter within last 12 months    Recent Outpatient Visits           10 months ago Acute conjunctivitis of left eye, unspecified acute conjunctivitis type   [facility] Smitty Cords, DO   1 year ago Acquired hypothyroidism   [facility] [place], Netta Neat, DO

## 2022-11-27 ENCOUNTER — Telehealth: Payer: BC Managed Care – PPO | Admitting: Nurse Practitioner

## 2022-11-27 DIAGNOSIS — B9689 Other specified bacterial agents as the cause of diseases classified elsewhere: Secondary | ICD-10-CM

## 2022-11-27 DIAGNOSIS — J208 Acute bronchitis due to other specified organisms: Secondary | ICD-10-CM

## 2022-11-27 MED ORDER — ALBUTEROL SULFATE HFA 108 (90 BASE) MCG/ACT IN AERS: 2.0000 | INHALATION_SPRAY | Freq: Four times a day (QID) | RESPIRATORY_TRACT | 0 refills | Status: DC | PRN

## 2022-11-27 MED ORDER — AZITHROMYCIN 250 MG PO TABS: ORAL_TABLET | ORAL | 0 refills | Status: AC

## 2022-11-27 NOTE — Progress Notes (Signed)
Virtual Visit Consent   [name], you are scheduled for a virtual visit with a [facility] provider today. Just as with appointments in the office, your consent must be obtained to participate. Your consent will be active for this visit and any virtual visit you may have with one of our providers in the next 365 days. If you have a MyChart account, a copy of this consent can be sent to you electronically.  As this is a virtual visit, video technology does not allow for your provider to perform a traditional examination. This may limit your provider's ability to fully assess your condition. If your provider identifies any concerns that need to be evaluated in person or the need to arrange testing (such as labs, EKG, etc.), we will make arrangements to do so. Although advances in technology are sophisticated, we cannot ensure that it will always work on either your end or our end. If the connection with a video visit is poor, the visit may have to be switched to a telephone visit. With either a video or telephone visit, we are not always able to ensure that we have a secure connection.  By engaging in this virtual visit, you consent to the provision of healthcare and authorize for your insurance to be billed (if applicable) for the services provided during this visit. Depending on your insurance coverage, you may receive a charge related to this service.  I need to obtain your verbal consent now. Are you willing to proceed with your visit today? Anne Henderson has provided verbal consent on 11/27/2022 for a virtual visit (video or telephone). Claiborne Rigg, NP  Date: 11/27/2022 4:38 PM  Virtual Visit via Video Note   I, Claiborne Rigg, connected with  Anne Henderson  ([id number], [birth year]) on 11/27/22 at  5:15 PM EST by a video-enabled telemedicine application and verified that I am speaking with the correct person using two identifiers.  Location: Patient: Virtual Visit Location Patient:  Home Provider: Virtual Visit Location Provider: Home Office   I discussed the limitations of evaluation and management by telemedicine and the availability of in person appointments. The patient expressed understanding and agreed to proceed.    History of Present Illness: Anne Henderson is a 31 y.o. who identifies as a female who was assigned female at birth, and is being seen today for bronchitis symptoms.   [name] states she has been sick for 2 weeks.  Current symptoms include productive cough with green sputum, fever with Tmax 101, nasal congestion, rhinorrhea.  COVID test was negative.  She denies any shortness of breath or chest pain.    Problems:  Patient Active Problem List   Diagnosis Date Noted   Acquired hypothyroidism 05/07/2021   Morbid obesity with BMI of 45.0-49.9, adult (HCC) 05/07/2021    Allergies: No Known Allergies Medications:  Current Outpatient Medications:    azithromycin (ZITHROMAX) 250 MG tablet, Take 2 tablets on day 1, then 1 tablet daily on days 2 through 5, Disp: 6 tablet, Rfl: 0   albuterol (VENTOLIN HFA) 108 (90 Base) MCG/ACT inhaler, Inhale 2 puffs into the lungs every 6 (six) hours as needed for wheezing or shortness of breath., Disp: 8 g, Rfl: 0   levothyroxine (SYNTHROID) 75 MCG tablet, TAKE 1 TABLET BY MOUTH ONCE DAILY ON AN EMPTY STOMACH. WAIT 30 MINUTES BEFORE TAKING OTHER MEDS., Disp: 90 tablet, Rfl: 1   valACYclovir (VALTREX) 1000 MG tablet, Take 1 tablet (1,000 mg total) by mouth 2 (two) times daily.  For 7-10 days as needed for flare cold sore, Disp: 20 tablet, Rfl: 2  Observations/Objective: Patient is well-developed, well-nourished in no acute distress.  Resting comfortably at home.  Head is normocephalic, atraumatic.  No labored breathing.  Speech is clear and coherent with logical content.  Patient is alert and oriented at baseline.    Assessment and Plan: 1. Acute bacterial bronchitis - azithromycin (ZITHROMAX) 250 MG tablet; Take 2  tablets on day 1, then 1 tablet daily on days 2 through 5  Dispense: 6 tablet; Refill: 0 - albuterol (VENTOLIN HFA) 108 (90 Base) MCG/ACT inhaler; Inhale 2 puffs into the lungs every 6 (six) hours as needed for wheezing or shortness of breath.  Dispense: 8 g; Refill: 0 INSTRUCTIONS: use a humidifier for nasal congestion Drink plenty of fluids, rest and wash hands frequently to avoid the spread of infection Alternate tylenol and Motrin for relief of fever   Follow Up Instructions: I discussed the assessment and treatment plan with the patient. The patient was provided an opportunity to ask questions and all were answered. The patient agreed with the plan and demonstrated an understanding of the instructions.  A copy of instructions were sent to the patient via MyChart unless otherwise noted below.    The patient was advised to call back or seek an in-person evaluation if the symptoms worsen or if the condition fails to improve as anticipated.  Time:  I spent 11 minutes with the patient via telehealth technology discussing the above problems/concerns.    [name], NP

## 2022-11-27 NOTE — Patient Instructions (Signed)
  Anne Henderson, thank you for joining Claiborne Rigg, NP for today's virtual visit.  While this provider is not your primary care provider (PCP), if your PCP is located in our provider database this encounter information will be shared with them immediately following your visit.   A [facility] MyChart account gives you access to today's visit and all your visits, tests, and labs performed at [facility] " click here if you don't have a [facility] MyChart account or go to [email or web address]  Consent: (Patient) Anne Henderson provided verbal consent for this virtual visit at the beginning of the encounter.  Current Medications:  Current Outpatient Medications:    azithromycin (ZITHROMAX) 250 MG tablet, Take 2 tablets on day 1, then 1 tablet daily on days 2 through 5, Disp: 6 tablet, Rfl: 0   albuterol (VENTOLIN HFA) 108 (90 Base) MCG/ACT inhaler, Inhale 2 puffs into the lungs every 6 (six) hours as needed for wheezing or shortness of breath., Disp: 8 g, Rfl: 0   levothyroxine (SYNTHROID) 75 MCG tablet, TAKE 1 TABLET BY MOUTH ONCE DAILY ON AN EMPTY STOMACH. WAIT 30 MINUTES BEFORE TAKING OTHER MEDS., Disp: 90 tablet, Rfl: 1   valACYclovir (VALTREX) 1000 MG tablet, Take 1 tablet (1,000 mg total) by mouth 2 (two) times daily. For 7-10 days as needed for flare cold sore, Disp: 20 tablet, Rfl: 2   Medications ordered in this encounter:  Meds ordered this encounter  Medications   azithromycin (ZITHROMAX) 250 MG tablet    Sig: Take 2 tablets on day 1, then 1 tablet daily on days 2 through 5    Dispense:  6 tablet    Refill:  0    Order Specific Question:   Supervising Provider    Answer:   Merrilee Jansky [[id number]]   albuterol (VENTOLIN HFA) 108 (90 Base) MCG/ACT inhaler    Sig: Inhale 2 puffs into the lungs every 6 (six) hours as needed for wheezing or shortness of breath.    Dispense:  8 g    Refill:  0    Order Specific Question:   Supervising Provider    Answer:    Merrilee Jansky [id number]     *If you need refills on other medications prior to your next appointment, please contact your pharmacy*  Follow-Up: Call back or seek an in-person evaluation if the symptoms worsen or if the condition fails to improve as anticipated.  [facility] [phone]  Other Instructions INSTRUCTIONS: use a humidifier for nasal congestion Drink plenty of fluids, rest and wash hands frequently to avoid the spread of infection Alternate tylenol and Motrin for relief of fever    If you have been instructed to have an in-person evaluation today at a local Urgent Care facility, please use the link below. It will take you to a list of all of our available [facility], including address, phone number and hours of operation. Please do not delay care.  [facility]  If you or a family member do not have a primary care provider, use the link below to schedule a visit and establish care. When you choose a [facility] primary care physician or advanced practice provider, you gain a long-term partner in health. Find a Primary Care Provider  Learn more about [facility]'s in-office and virtual care options: [facility] - Get Care Now

## 2022-12-15 ENCOUNTER — Other Ambulatory Visit: Payer: Self-pay | Admitting: Family Medicine

## 2022-12-15 DIAGNOSIS — E039 Hypothyroidism, unspecified: Secondary | ICD-10-CM

## 2022-12-15 NOTE — Telephone Encounter (Signed)
Unable to refill per protocol, Rx request is too soon. Last refill 11/02/22 for 90 and 1 refill.  Requested Prescriptions  Pending Prescriptions Disp Refills   levothyroxine (SYNTHROID) 75 MCG tablet [Pharmacy Med Name: LEVOTHYROXINE SODIUM 75 MCG TAB] 90 tablet 1    Sig: TAKE 1 TABLET BY MOUTH ONCE DAILY ON AN EMPTY STOMACH. WAIT 30 MINUTES BEFORE TAKING OTHER MEDS.     Endocrinology:  Hypothyroid Agents Failed - 12/15/2022  9:06 AM      Failed - TSH in normal range and within 360 days    TSH  Date Value Ref Range Status  06/18/2021 2.99 mIU/L Final    Comment:              Reference Range .           > or = 20 Years  0.40-4.50 .                Pregnancy Ranges           First trimester    0.26-2.66           Second trimester   0.55-2.73           Third trimester    0.43-2.91          Passed - Valid encounter within last 12 months    Recent Outpatient Visits           11 months ago Acute conjunctivitis of left eye, unspecified acute conjunctivitis type   [facility] Olin Hauser, DO   1 year ago Acquired hypothyroidism   [place], Devonne Doughty, DO

## 2023-02-28 ENCOUNTER — Ambulatory Visit: Payer: BC Managed Care – PPO | Admitting: Family Medicine

## 2023-03-18 ENCOUNTER — Ambulatory Visit: Payer: Managed Care, Other (non HMO) | Admitting: Family Medicine

## 2023-03-18 ENCOUNTER — Other Ambulatory Visit: Payer: Self-pay | Admitting: Family Medicine

## 2023-03-18 VITALS — BP 122/78 | HR 80 | Ht 66.0 in | Wt 296.8 lb

## 2023-03-18 DIAGNOSIS — R7309 Other abnormal glucose: Secondary | ICD-10-CM

## 2023-03-18 DIAGNOSIS — E039 Hypothyroidism, unspecified: Secondary | ICD-10-CM

## 2023-03-18 DIAGNOSIS — Z6841 Body Mass Index (BMI) 40.0 and over, adult: Secondary | ICD-10-CM | POA: Diagnosis not present

## 2023-03-18 NOTE — Patient Instructions (Signed)
Thank you for coming to the office today.  Labs next week.  Call insurance find cost and coverage of the following - check the following: - Drug Tier, Preferred List, On Formulary - All will require a "Prior Authorization" from [place] first, before you can find out the cost - Find out if there is "Step Therapy" (other medicines required before you can try these)  Once you pick the one you want to try, let me know - we can get a sample ready IF we have it in stock. Then try it - and before running out of medicine, contact me back to order your Rx so we have time to get it processed.  For Weight Loss / Obesity only  Wegovy (same as Ozempic) weekly injection - start 0.25mg  weekly, 1 dose per pen, single use, auto-injector  2. Saxenda - DAILY injection - start 0.6mg  injection DAILY, you can increase the dose by 1 notch or 0.6 mg per week, if you don't tolerate a dose, can reduce it the next day.  3. Zepbound (Tirzepatide) - WEEKLY injection  4. Contrave - oral medication, appetite suppression has wellbutrin/bupropion and naltrexone in it and it can also help with appetite, it is ordered through a speciality pharmacy.  Future make sure your insurance has weight loss coverage  WEIGHT MANAGEMENT  Dr Quillian Quince  [facility] [address] [place], [place] [address] Ph: [phone]    DUE for FASTING BLOOD WORK (no food or drink after midnight before the lab appointment, only water or coffee without cream/sugar on the morning of)  SCHEDULE "Lab Only" visit in the morning at the clinic for lab draw in 03/23/23   - Make sure Lab Only appointment is at about 1 week before your next appointment, so that results will be available  For Lab Results, once available within 2-3 days of blood draw, you can can log in to MyChart online to view your results and a brief explanation. Also, we can discuss results at next follow-up visit.   Please schedule a Follow-up  Appointment to: No follow-ups on file.  If you have any other questions or concerns, please feel free to call the office or send a message through MyChart. You may also schedule an earlier appointment if necessary.  Additionally, you may be receiving a survey about your experience at our office within a few days to 1 week by e-mail or mail. We value your feedback.  Saralyn Pilar, DO [facility], [place]

## 2023-03-18 NOTE — Progress Notes (Signed)
Subjective:    Patient ID: Anne Henderson, female    DOB: [birth year], 32 y.o.   MRN: [id number]  Anne Henderson is a 32 y.o. female presenting on 03/18/2023 for Labs Only (The patient would like to get bloodwork to rule out diabetes. She has a family history of diabetes ) and Fatigue (Pt complains of increasing fatigue, concern her thyroid medication might need to be adjusted.)   HPI   Morbid Obesity BMI >47 Hypothyroidism  Interested in labs with A1c and Thyroid. Last lab 2022. Fam history DM pre DM  Weight down to 270s previously > 1-2 year ago but then gained.  Currently daily routine Activity with Walking dogs in AM before work, Stairs at work Diet with Skips breakfast, Lunch veggie sticks, chicken salad, whole wheat deli sandwich, dinner Mostly balanced protein, veggies, carb  Has not tried wt loss meds. Will consider this option. Goal to keep improving exercise duration and intensity      03/18/2023    4:28 PM 05/07/2021   10:48 AM  Depression screen PHQ 2/9  Decreased Interest 0 0  Down, Depressed, Hopeless 0 0  PHQ - 2 Score 0 0  Altered sleeping 0   Tired, decreased energy 1   Change in appetite 0   Feeling bad or failure about yourself  0   Trouble concentrating 0   Moving slowly or fidgety/restless 0   Suicidal thoughts 0   PHQ-9 Score 1   Difficult doing work/chores Not difficult at all     Social History   Tobacco Use   Smoking status: Never   Smokeless tobacco: Never  Vaping Use   Vaping Use: Never used  Substance Use Topics   Alcohol use: Yes    Alcohol/week: 3.0 standard drinks of alcohol    Types: 3 Cans of beer per week   Drug use: Never    Review of Systems Per HPI unless specifically indicated above     Objective:    BP 122/78 (BP Location: Right Arm, Patient Position: Sitting)   Pulse 80   Ht  (1.676 m)   Wt 296 lb 12.8 oz (134.6 kg)   SpO2 98%   BMI 47.90 kg/m   Wt Readings from Last 3 Encounters:  03/18/23 296 lb 12.8 oz  (134.6 kg)  12/30/21 284 lb 6.4 oz (129 kg)  05/07/21 (!) 302 lb 9.6 oz (137.3 kg)    Physical Exam Vitals and nursing note reviewed.  Constitutional:      General: She is not in acute distress.    Appearance: She is well-developed. She is obese. She is not diaphoretic.     Comments: Well-appearing, comfortable, cooperative  HENT:     Head: Normocephalic and atraumatic.  Eyes:     General:        Right eye: No discharge.        Left eye: No discharge.     Conjunctiva/sclera: Conjunctivae normal.  Neck:     Thyroid: No thyromegaly.  Cardiovascular:     Rate and Rhythm: Normal rate and regular rhythm.     Heart sounds: Normal heart sounds. No murmur heard. Pulmonary:     Effort: Pulmonary effort is normal. No respiratory distress.     Breath sounds: Normal breath sounds. No wheezing or rales.  Musculoskeletal:        General: Normal range of motion.     Cervical back: Normal range of motion and neck supple.     Right lower leg: No  edema.     Left lower leg: No edema.  Lymphadenopathy:     Cervical: No cervical adenopathy.  Skin:    General: Skin is warm and dry.     Findings: No erythema or rash.  Neurological:     Mental Status: She is alert and oriented to person, place, and time.  Psychiatric:        Behavior: Behavior normal.     Comments: Well groomed, good eye contact, normal speech and thoughts    Results for orders placed or performed in visit on 06/18/21  T4, free  Result Value Ref Range   Free T4 1.3 0.8 - 1.8 ng/dL  TSH  Result Value Ref Range   TSH 2.99 mIU/L      Assessment & Plan:   Problem List Items Addressed This Visit     Acquired hypothyroidism   Morbid obesity with BMI of 45.0-49.9, adult - Primary    Hypothyroidism Last lab 2022 Previously controlled Now with fatigue and weight gain Currently on Levothyroxine daily Check labs next week, and adjust med accordingly  Morbid Obesity BMI >47 Concern with abnormal weight gain vs  fluctuation difficulty losing weight Comorbid concerns with hypothyroidism Fam history of DM vs PreDM Due for screening for sugar and other work up Discussion on lifestyle diet exercise management Consider referral vs medication options, she will check into coverage.  No orders of the defined types were placed in this encounter.     Follow up plan: Return in about 5 days (around 03/23/2023) for Labs 4/24 8am.  Future labs ordered for 03/23/23  CMET, TSH T4, A1c, CBC, lipid   Saralyn Pilar, DO [facility] 03/18/2023, 4:20 PM

## 2023-03-20 ENCOUNTER — Encounter: Payer: Self-pay | Admitting: Family Medicine

## 2023-03-21 ENCOUNTER — Other Ambulatory Visit: Payer: Self-pay | Admitting: Family Medicine

## 2023-03-21 MED ORDER — ZEPBOUND 2.5 MG/0.5ML ~~LOC~~ SOAJ: 2.5000 mg | SUBCUTANEOUS | 0 refills | Status: DC

## 2023-03-21 NOTE — Addendum Note (Signed)
Addended by: Smitty Cords on: 03/21/2023 07:10 PM   Modules accepted: Orders

## 2023-03-22 NOTE — Telephone Encounter (Signed)
Would you be able to check status of PA for Zepbound?  I ordered it on Monday 4/22. Now pharmacy sent this message back saying "Not covered".  On my screen it requires Prior Auth only but could be covered.  Let me know what the situation is, and if we need to go in different direction, patient will need to check with her insurance to see what they do cover if anything for weight management.  Saralyn Pilar, DO [facility] 03/22/2023, 6:16 PM

## 2023-03-22 NOTE — Telephone Encounter (Signed)
Requested medication (s) are due for refill today - no  Requested medication (s) are on the active medication list -yes  Future visit scheduled -no  Last refill: 03/21/23  Notes to clinic: Pharmacy request: script clarification- not covered  Requested Prescriptions  Pending Prescriptions Disp Refills   ZEPBOUND 2.5 MG/0.5ML Pen [Pharmacy Med Name: ZEPBOUND 2.5 MG/0.5 ML PEN]  0    Sig: INJECT 2.5 MG SUBCUTANEOUSLY WEEKLY     Off-Protocol Failed - 03/21/2023  7:17 PM      Failed - Medication not assigned to a protocol, review manually.      Passed - Valid encounter within last 12 months    Recent Outpatient Visits           4 days ago Morbid obesity with BMI of 45.0-49.9, adult   [place] [place], Netta Neat, DO   1 year ago Acute conjunctivitis of left eye, unspecified acute conjunctivitis type   [facility] Smitty Cords, DO   1 year ago Acquired hypothyroidism   [facility] Smitty Cords, DO                 Requested Prescriptions  Pending Prescriptions Disp Refills   ZEPBOUND 2.5 MG/0.5ML Pen [Pharmacy Med Name: ZEPBOUND 2.5 MG/0.5 ML PEN]  0    Sig: INJECT 2.5 MG SUBCUTANEOUSLY WEEKLY     Off-Protocol Failed - 03/21/2023  7:17 PM      Failed - Medication not assigned to a protocol, review manually.      Passed - Valid encounter within last 12 months    Recent Outpatient Visits           4 days ago Morbid obesity with BMI of 45.0-49.9, adult   [place] [facility] [place], Netta Neat, DO   1 year ago Acute conjunctivitis of left eye, unspecified acute conjunctivitis type   [facility] Smitty Cords, DO   1 year ago Acquired hypothyroidism   [place] [facility] [place], Netta Neat, [place]

## 2023-03-23 ENCOUNTER — Other Ambulatory Visit: Payer: Managed Care, Other (non HMO)

## 2023-03-23 DIAGNOSIS — E039 Hypothyroidism, unspecified: Secondary | ICD-10-CM

## 2023-03-23 DIAGNOSIS — R7309 Other abnormal glucose: Secondary | ICD-10-CM

## 2023-03-23 LAB — CBC WITH DIFFERENTIAL/PLATELET
Eosinophils Relative: 3.1 %
HCT: 38.3 % (ref 35.0–45.0)
Hemoglobin: 12.9 g/dL (ref 11.7–15.5)
MCHC: 33.7 g/dL (ref 32.0–36.0)
RBC: 4.25 10*6/uL (ref 3.80–5.10)

## 2023-03-23 NOTE — Telephone Encounter (Signed)
I do not see that a prior Berkley Harvey has been initiated.  Covermymeds is experiencing delays due to full queue.

## 2023-03-23 NOTE — Telephone Encounter (Signed)
Message from [employer]: Drug is not covered by plan CoverMyMeds Recommendation This medication may be excluded from the patient's benefit. For more information, please reach out to [employer] directly at [phone].  (Key: [id number])

## 2023-03-24 LAB — HEMOGLOBIN A1C
Hgb A1c MFr Bld: 5.6 % of total Hgb (ref <5.7)
Mean Plasma Glucose: 114 mg/dL
eAG (mmol/L): 6.3 mmol/L

## 2023-03-24 LAB — COMPLETE METABOLIC PANEL WITH GFR
AG Ratio: 1.7 (calc) (ref 1.0–2.5)
ALT: 17 U/L (ref 6–29)
AST: 16 U/L (ref 10–30)
Albumin: 3.8 g/dL (ref 3.6–5.1)
Alkaline phosphatase (APISO): 48 U/L (ref 31–125)
BUN: 15 mg/dL (ref 7–25)
CO2: 24 mmol/L (ref 20–32)
Calcium: 8.9 mg/dL (ref 8.6–10.2)
Chloride: 106 mmol/L (ref 98–110)
Creat: 0.8 mg/dL (ref 0.50–0.97)
Globulin: 2.3 g/dL (calc) (ref 1.9–3.7)
Glucose, Bld: 97 mg/dL (ref 65–99)
Potassium: 4.2 mmol/L (ref 3.5–5.3)
Sodium: 137 mmol/L (ref 135–146)
Total Bilirubin: 0.3 mg/dL (ref 0.2–1.2)
Total Protein: 6.1 g/dL (ref 6.1–8.1)
eGFR: 101 mL/min/{1.73_m2} (ref >=60)

## 2023-03-24 LAB — TSH: TSH: 3.89 mIU/L

## 2023-03-24 LAB — CBC WITH DIFFERENTIAL/PLATELET
Absolute Monocytes: 494 cells/uL (ref 200–950)
Basophils Absolute: 33 cells/uL (ref 0–200)
Basophils Relative: 0.5 %
Eosinophils Absolute: 202 cells/uL (ref 15–500)
Lymphs Abs: 1482 cells/uL (ref 850–3900)
MCH: 30.4 pg (ref 27.0–33.0)
MCV: 90.1 fL (ref 80.0–100.0)
MPV: 10.2 fL (ref 7.5–12.5)
Monocytes Relative: 7.6 %
Neutro Abs: 4290 cells/uL (ref 1500–7800)
Neutrophils Relative %: 66 %
Platelets: 246 10*3/uL (ref 140–400)
RDW: 12.8 % (ref 11.0–15.0)
Total Lymphocyte: 22.8 %
WBC: 6.5 10*3/uL (ref 3.8–10.8)

## 2023-03-24 LAB — LIPID PANEL
Cholesterol: 210 mg/dL — ABNORMAL HIGH (ref <200)
HDL: 62 mg/dL (ref >=50)
LDL Cholesterol (Calc): 124 mg/dL (calc) — ABNORMAL HIGH
Non-HDL Cholesterol (Calc): 148 mg/dL (calc) — ABNORMAL HIGH (ref <130)
Total CHOL/HDL Ratio: 3.4 (calc) (ref <5.0)
Triglycerides: 127 mg/dL (ref <150)

## 2023-03-24 LAB — T4, FREE: Free T4: 1 ng/dL (ref 0.8–1.8)

## 2023-04-13 ENCOUNTER — Other Ambulatory Visit: Payer: Self-pay

## 2023-04-13 MED ORDER — CONTRAVE 8-90 MG PO TB12
ORAL_TABLET | ORAL | 0 refills | Status: DC
Start: 2023-04-13 — End: 2023-04-14

## 2023-04-13 NOTE — Progress Notes (Signed)
Contrave samples given to patient she will call for refill if appropriate.  See mychart message.

## 2023-04-13 NOTE — Telephone Encounter (Signed)
Patient picked up samples of contrave.  Please advise next step.  She said we could try sending to [facility] if not [facility].

## 2023-04-14 ENCOUNTER — Other Ambulatory Visit: Payer: Self-pay | Admitting: Family Medicine

## 2023-04-29 ENCOUNTER — Other Ambulatory Visit: Payer: Self-pay | Admitting: Family Medicine

## 2023-04-29 DIAGNOSIS — E039 Hypothyroidism, unspecified: Secondary | ICD-10-CM

## 2023-04-29 NOTE — Telephone Encounter (Signed)
Requested Prescriptions  Pending Prescriptions Disp Refills   levothyroxine (SYNTHROID) 75 MCG tablet [Pharmacy Med Name: LEVOTHYROXINE SODIUM 75 MCG TAB] 90 tablet 3    Sig: TAKE 1 TABLET BY MOUTH ONCE DAILY ON AN EMPTY STOMACH. WAIT 30 MINUTES BEFORE TAKING OTHER MEDS.     Endocrinology:  Hypothyroid Agents Passed - 04/29/2023  9:06 AM      Passed - TSH in normal range and within 360 days    TSH  Date Value Ref Range Status  03/23/2023 3.89 mIU/L Final    Comment:              Reference Range .           > or = 20 Years  0.40-4.50 .                Pregnancy Ranges           First trimester    0.26-2.66           Second trimester   0.55-2.73           Third trimester    0.43-2.91          Passed - Valid encounter within last 12 months    Recent Outpatient Visits           1 month ago Morbid obesity with BMI of 45.0-49.9, adult [facility])   [facility], Netta Neat, DO   1 year ago Acute conjunctivitis of left eye, unspecified acute conjunctivitis type   [facility] Smitty Cords, DO   1 year ago Acquired hypothyroidism   [place] [place], Netta Neat, [place]

## 2023-05-18 ENCOUNTER — Encounter: Payer: Self-pay | Admitting: Family Medicine

## 2023-06-15 ENCOUNTER — Ambulatory Visit: Payer: Self-pay | Admitting: *Deleted

## 2023-06-15 NOTE — Telephone Encounter (Signed)
  Chief Complaint: diarrhea  Symptoms: diarrhea for 9 days- cramping at times, constant with solid foods- patient is hydrating but unable to stop diarrhea  Frequency: 9 days Pertinent Negatives: Patient denies vomiting,fevr Disposition: [] ED /[] Urgent Care (no appt availability in office) / [] Appointment(In office/virtual)/ []  [facility]/ [] Home Care/ [] Refused Recommended Disposition /[] [facility]/ [x]  Follow-up with PCP Additional Notes: Call to office- no open appointment- they will check with provider and advise from office- appt vs UC. Patient to expect call back.

## 2023-06-15 NOTE — Telephone Encounter (Signed)
Spoke with patient and advised per Dr Althea Charon.

## 2023-06-15 NOTE — Telephone Encounter (Signed)
Summary: keep food down and has had diarrhea   Pt stated she can't keep food down and has had diarrhea since last Monday. Upper abdominal pain yesterday; no pain today. Pt is requesting an appointment; no appointments are available.  Seeking clinical advice.         Reason for Disposition  [1] MODERATE diarrhea (e.g., 4-6 times / day more than normal) AND [2] present > 48 hours (2 days)  Answer Assessment - Initial Assessment Questions 1. DIARRHEA SEVERITY: "How bad is the diarrhea?" "How many more stools have you had in the past 24 hours than normal?"    - NO DIARRHEA (SCALE 0)   - MILD (SCALE 1-3): Few loose or mushy BMs; increase of 1-3 stools over normal daily number of stools; mild increase in ostomy output.   -  MODERATE (SCALE 4-7): Increase of 4-6 stools daily over normal; moderate increase in ostomy output.   -  SEVERE (SCALE 8-10; OR "WORST POSSIBLE"): Increase of 7 or more stools daily over normal; moderate increase in ostomy output; incontinence.     Moderate- after eating 2. ONSET: "When did the diarrhea begin?"      Last Monday- 7/8 3. BM CONSISTENCY: "How loose or watery is the diarrhea?"      watery 4. VOMITING: "Are you also vomiting?" If Yes, ask: "How many times in the past 24 hours?"      no 5. ABDOMEN PAIN: "Are you having any abdomen pain?" If Yes, ask: "What does it feel like?" (e.g., crampy, dull, intermittent, constant)      Yesterday- upper crampy pain 6. ABDOMEN PAIN SEVERITY: If present, ask: "How bad is the pain?"  (e.g., Scale 1-10; mild, moderate, or severe)   - MILD (1-3): doesn't interfere with normal activities, abdomen soft and not tender to touch    - MODERATE (4-7): interferes with normal activities or awakens from sleep, abdomen tender to touch    - SEVERE (8-10): excruciating pain, doubled over, unable to do any normal activities       Mild- dull pain 7. ORAL INTAKE: If vomiting, "Have you been able to drink liquids?" "How much liquids have you  had in the past 24 hours?"     Yes- drinking water- 80 oz, Gatorade-2/day 8. HYDRATION: "Any signs of dehydration?" (e.g., dry mouth [not just dry lips], too weak to stand, dizziness, new weight loss) "When did you last urinate?"     No- fatigue, dry mouth- one day 9. EXPOSURE: "Have you traveled to a foreign country recently?" "Have you been exposed to anyone with diarrhea?" "Could you have eaten any food that was spoiled?"     None known 10. ANTIBIOTIC USE: "Are you taking antibiotics now or have you taken antibiotics in the past 2 months?"       no 11. OTHER SYMPTOMS: "Do you have any other symptoms?" (e.g., fever, blood in stool)       no  Protocols used: Diarrhea-A-AH

## 2023-06-15 NOTE — Telephone Encounter (Signed)
For acute diarrheal illness, she would likely need to be evaluated for determining if any dehydration.  I would suggest OTC advice as there are not many or any regular rx for diarrhea.  She should try Imodium AD OTC to slow down diarrhea and work on oral rehydration with Gatorade G2 or Pedialyte or Apple Juice : Water 50/50.  Also can try Peppermint Oil capsules  IBGard OTC 180mg  take one 3 times daily to reduce diarrhea  Urgent care may be a good option to assess her to figure out hydration if needed.  Let me know if other questions.  Saralyn Pilar, DO [facility] 06/15/2023, 10:55 AM

## 2023-06-20 ENCOUNTER — Encounter: Payer: Self-pay | Admitting: Family Medicine

## 2023-06-20 NOTE — Telephone Encounter (Signed)
Please let patient know that Dr. Kirtland Bouchard is out of the office until Wednesday and will address this upon his return.

## 2023-06-22 ENCOUNTER — Other Ambulatory Visit: Payer: Self-pay

## 2023-06-22 ENCOUNTER — Other Ambulatory Visit: Payer: Self-pay | Admitting: Family Medicine

## 2023-06-22 DIAGNOSIS — R197 Diarrhea, unspecified: Secondary | ICD-10-CM

## 2023-06-22 DIAGNOSIS — R1084 Generalized abdominal pain: Secondary | ICD-10-CM

## 2023-06-23 ENCOUNTER — Encounter: Payer: Self-pay | Admitting: Family Medicine

## 2023-06-23 ENCOUNTER — Other Ambulatory Visit: Payer: Managed Care, Other (non HMO)

## 2023-06-23 ENCOUNTER — Telehealth: Payer: Managed Care, Other (non HMO) | Admitting: Family Medicine

## 2023-06-23 DIAGNOSIS — R197 Diarrhea, unspecified: Secondary | ICD-10-CM

## 2023-06-23 DIAGNOSIS — R1013 Epigastric pain: Secondary | ICD-10-CM

## 2023-06-23 DIAGNOSIS — R109 Unspecified abdominal pain: Secondary | ICD-10-CM

## 2023-06-23 DIAGNOSIS — R1084 Generalized abdominal pain: Secondary | ICD-10-CM

## 2023-06-23 DIAGNOSIS — K529 Noninfective gastroenteritis and colitis, unspecified: Secondary | ICD-10-CM

## 2023-06-23 LAB — CBC WITH DIFFERENTIAL/PLATELET
Absolute Monocytes: 445 cells/uL (ref 200–950)
Basophils Absolute: 43 cells/uL (ref 0–200)
Basophils Relative: 0.7 %
Hemoglobin: 12.3 g/dL (ref 11.7–15.5)
Lymphs Abs: 1409 cells/uL (ref 850–3900)
MCH: 29.6 pg (ref 27.0–33.0)
MCHC: 33.5 g/dL (ref 32.0–36.0)
MPV: 10.5 fL (ref 7.5–12.5)
Monocytes Relative: 7.3 %
Neutro Abs: 4063 cells/uL (ref 1500–7800)
Platelets: 270 10*3/uL (ref 140–400)
RBC: 4.16 10*6/uL (ref 3.80–5.10)
RDW: 12.4 % (ref 11.0–15.0)
Total Lymphocyte: 23.1 %

## 2023-06-23 MED ORDER — DICYCLOMINE HCL 10 MG PO CAPS
10.0000 mg | ORAL_CAPSULE | Freq: Three times a day (TID) | ORAL | 0 refills | Status: AC
Start: 2023-06-23 — End: ?

## 2023-06-23 NOTE — Progress Notes (Signed)
Subjective:    Patient ID: Anne Henderson, female    DOB: 10/13/1991, 32 y.o.   MRN: [id number]  Soila Printup is a 31 y.o. female presenting on 06/23/2023 for Diarrhea  Virtual / Telehealth Encounter - Video Visit via MyChart The purpose of this virtual visit is to provide medical care while limiting exposure to the novel coronavirus (COVID19) for both patient and office staff.  Consent was obtained for remote visit:  Yes.   Answered questions that patient had about telehealth interaction:  Yes.   I discussed the limitations, risks, security and privacy concerns of performing an evaluation and management service by video/telephone. I also discussed with the patient that there may be a patient responsible charge related to this service. The patient expressed understanding and agreed to proceed.  Patient Location: Home Provider Location: [facility] (Office)  Participants in virtual visit: - Patient: Erendira Crabtree - CMA: Darrol Angel CMA - Provider: Dr Althea Charon   HPI  Postprandial Abdominal Pain Epigastric Diarrhea  Recent history with 2.5 weeks of symptoms with persistent diarrhea worst postprandial, and no known travel history or other triggers. No significant viral gastro symptoms. No sick contacts. No suspected foodborne.  Significantly improved diarrhea throughout the day, but now post-prandial diarrhea has improved still present, about 2 x. Describes brown-yellowish. No blood or dark stools. Has not had a well formed BM lately.  Denies nausea vomiting fever chills  Past Surgical History:  Procedure Laterality Date   COLONOSCOPY WITH PROPOFOL N/A 11/12/2016   Procedure: COLONOSCOPY WITH PROPOFOL;  Surgeon: Scot Jun, MD;  Location: [facility] ENDOSCOPY;  Service: Endoscopy;  Laterality: N/A;   ESOPHAGOGASTRODUODENOSCOPY (EGD) WITH PROPOFOL N/A 11/12/2016   Procedure: ESOPHAGOGASTRODUODENOSCOPY (EGD) WITH PROPOFOL;  Surgeon: Scot Jun, MD;   Location: [facility] ENDOSCOPY;  Service: Endoscopy;  Laterality: N/A;       03/18/2023    4:28 PM 05/07/2021   10:48 AM  Depression screen PHQ 2/9  Decreased Interest 0 0  Down, Depressed, Hopeless 0 0  PHQ - 2 Score 0 0  Altered sleeping 0   Tired, decreased energy 1   Change in appetite 0   Feeling bad or failure about yourself  0   Trouble concentrating 0   Moving slowly or fidgety/restless 0   Suicidal thoughts 0   PHQ-9 Score 1   Difficult doing work/chores Not difficult at all     Social History   Tobacco Use   Smoking status: Never   Smokeless tobacco: Never  Vaping Use   Vaping status: Never Used  Substance Use Topics   Alcohol use: Yes    Alcohol/week: 3.0 standard drinks of alcohol    Types: 3 Cans of beer per week   Drug use: Never    Review of Systems Per HPI unless specifically indicated above     Objective:    There were no vitals taken for this visit.  Wt Readings from Last 3 Encounters:  03/18/23 296 lb 12.8 oz (134.6 kg)  12/30/21 284 lb 6.4 oz (129 kg)  05/07/21 (!) 302 lb 9.6 oz (137.3 kg)    Physical Exam  Note examination was completely remotely via video observation objective data only  Gen - well-appearing, no acute distress or apparent pain, comfortable HEENT - eyes appear clear without discharge or redness Heart/Lungs - cannot examine virtually - observed no evidence of coughing or labored breathing. Abd - cannot examine virtually  Skin - face visible today- no rash Neuro - awake,  alert, oriented Psych - not anxious appearing   Results for orders placed or performed in visit on 03/23/23  T4, free  Result Value Ref Range   Free T4 1.0 0.8 - 1.8 ng/dL  TSH  Result Value Ref Range   TSH 3.89 mIU/L  Lipid panel  Result Value Ref Range   Cholesterol 210 (H) <200 mg/dL   HDL 62 > OR = 50 mg/dL   Triglycerides 161 <096 mg/dL   LDL Cholesterol (Calc) 124 (H) mg/dL (calc)   Total CHOL/HDL Ratio 3.4 <5.0 (calc)   Non-HDL Cholesterol  (Calc) 148 (H) <130 mg/dL (calc)  Hemoglobin E4V  Result Value Ref Range   Hgb A1c MFr Bld 5.6 <5.7 % of total Hgb   Mean Plasma Glucose 114 mg/dL   eAG (mmol/L) 6.3 mmol/L  CBC with Differential/Platelet  Result Value Ref Range   WBC 6.5 3.8 - 10.8 Thousand/uL   RBC 4.25 3.80 - 5.10 Million/uL   Hemoglobin 12.9 11.7 - 15.5 g/dL   HCT 40.9 81.1 - 91.4 %   MCV 90.1 80.0 - 100.0 fL   MCH 30.4 27.0 - 33.0 pg   MCHC 33.7 32.0 - 36.0 g/dL   RDW 78.2 95.6 - 21.3 %   Platelets 246 140 - 400 Thousand/uL   MPV 10.2 7.5 - 12.5 fL   Neutro Abs 4,290 1,500 - 7,800 cells/uL   Lymphs Abs 1,482 850 - 3,900 cells/uL   Absolute Monocytes 494 200 - 950 cells/uL   Eosinophils Absolute 202 15 - 500 cells/uL   Basophils Absolute 33 0 - 200 cells/uL   Neutrophils Relative % 66 %   Total Lymphocyte 22.8 %   Monocytes Relative 7.6 %   Eosinophils Relative 3.1 %   Basophils Relative 0.5 %  COMPLETE METABOLIC PANEL WITH GFR  Result Value Ref Range   Glucose, Bld 97 65 - 99 mg/dL   BUN 15 7 - 25 mg/dL   Creat 0.86 5.78 - 4.69 mg/dL   eGFR 629 > OR = 60 BM/WUX/3.24M0   BUN/Creatinine Ratio SEE NOTE: 6 - 22 (calc)   Sodium 137 135 - 146 mmol/L   Potassium 4.2 3.5 - 5.3 mmol/L   Chloride 106 98 - 110 mmol/L   CO2 24 20 - 32 mmol/L   Calcium 8.9 8.6 - 10.2 mg/dL   Total Protein 6.1 6.1 - 8.1 g/dL   Albumin 3.8 3.6 - 5.1 g/dL   Globulin 2.3 1.9 - 3.7 g/dL (calc)   AG Ratio 1.7 1.0 - 2.5 (calc)   Total Bilirubin 0.3 0.2 - 1.2 mg/dL   Alkaline phosphatase (APISO) 48 31 - 125 U/L   AST 16 10 - 30 U/L   ALT 17 6 - 29 U/L      Assessment & Plan:   Problem List Items Addressed This Visit   None Visit Diagnoses     Diarrhea, unspecified type    -  Primary   Relevant Medications   dicyclomine (BENTYL) 10 MG capsule   Postprandial diarrhea       Relevant Medications   dicyclomine (BENTYL) 10 MG capsule   Abdominal cramping       Relevant Medications   dicyclomine (BENTYL) 10 MG capsule    Epigastric pain          Improving past 1-2 days. Hopefully it is self limited viral gastro  Still has postprandial diarrhea, mild reduced now 1-2 times instead of 6-7 No red flag GI diarrheal symptoms Suspect viral gastro most  likely Could have underlying IBS-D type as well. Given functional GI symptoms.  Some epigastric abdomen pain vs generalized cramping.  Trial on OTC Imodium, Peppermint and remedy without relief  Rx Dicyclomine as needed for abdominal cramping urgency bowels. Take with meal as needed.  Labs today pending  She collected container. Stool Study for GI infection / pathogen, pending once we receive stool sample container kit. May take several days to result after.  Stay tuned on results  May reconsider abdominal imaging [place] if concern for gallbladder or CT if worsening or more severe symptoms.  Future Imaging? As indicated.  No orders of the defined types were placed in this encounter.     Meds ordered this encounter  Medications   dicyclomine (BENTYL) 10 MG capsule    Sig: Take 1 capsule (10 mg total) by mouth 4 (four) times daily -  before meals and at bedtime. As needed for abdominal cramping, diarrhea urgency    Dispense:  30 capsule    Refill:  0     Follow up plan: Return if symptoms worsen or fail to improve.   Patient verbalizes understanding with the above medical recommendations including the limitation of remote medical advice.  Specific follow-up and call-back criteria were given for patient to follow-up or seek medical care more urgently if needed.  Total duration of direct patient care provided via video conference: 10 minutes    Saralyn Pilar, DO [facility] 06/23/2023, 11:15 AM

## 2023-06-23 NOTE — Patient Instructions (Addendum)
Thank you for coming to the office today.  Labs today pending Stool Study for GI infection / pathogen, pending once we receive stool sample container kit. May take several days to result after. Stay tuned on results  Dicyclomine as needed for abdominal cramping urgency bowels. Take with meal as needed.  May reconsider abdominal imaging [place] if concern for gallbladder or CT if worsening or more severe symptoms.  I am out of office Aug 1 - Aug 12. We have coverage if you need assistance.   Please schedule a Follow-up Appointment to: No follow-ups on file.  If you have any other questions or concerns, please feel free to call the office or send a message through MyChart. You may also schedule an earlier appointment if necessary.  Additionally, you may be receiving a survey about your experience at our office within a few days to 1 week by e-mail or mail. We value your feedback.  Saralyn Pilar, DO [facility], [place]

## 2023-07-13 ENCOUNTER — Ambulatory Visit: Payer: Managed Care, Other (non HMO) | Admitting: Family Medicine

## 2023-08-10 ENCOUNTER — Encounter: Payer: Self-pay | Admitting: Family Medicine

## 2023-08-10 MED ORDER — WEGOVY 0.5 MG/0.5ML ~~LOC~~ SOAJ
0.5000 mg | SUBCUTANEOUS | 2 refills | Status: DC
Start: 2023-08-10 — End: 2023-10-12

## 2023-10-12 ENCOUNTER — Encounter: Payer: Self-pay | Admitting: Family Medicine

## 2023-10-12 MED ORDER — WEGOVY 0.5 MG/0.5ML ~~LOC~~ SOAJ
0.5000 mg | SUBCUTANEOUS | 0 refills | Status: DC
Start: 2023-10-12 — End: 2023-12-30

## 2023-12-27 ENCOUNTER — Other Ambulatory Visit: Payer: Self-pay | Admitting: Family Medicine

## 2023-12-29 NOTE — Telephone Encounter (Signed)
Please notify patient that I don't accept automatic refill requests for the Semaglutide injections from [facility].  We will need a direct confirmation from patient before we can order this medicine.  She was previously on 0.5mg  weekly dose. Usually we dose increase if patients prefer to continue to escalate therapy to reach weight loss goals.  Next dose is 1mg , then 1.7mg , then 2.4mg .  Please contact her and ask what dose she prefers, and once you confirm, I can fax an order form to [facility].  Thank you  Saralyn Pilar, DO [facility] 12/29/2023, 1:37 PM

## 2023-12-29 NOTE — Telephone Encounter (Signed)
Requested medication (s) are due for refill today: Yes  Requested medication (s) are on the active medication list: Yes  Last refill:  10/12/23  Future visit scheduled: No  Notes to clinic:  See request.    Requested Prescriptions  Pending Prescriptions Disp Refills   WEGOVY 0.5 MG/0.5ML SOAJ [Pharmacy Med Name: SEMAGLUTIDE1MG /MLINJ(5)] 5 mL 2    Sig: INJECT 50 UNITS (0.5MG ) SUBCUTANEOUSLY ONCE A WEEK FOR 4 WEEKS     Endocrinology:  Diabetes - GLP-1 Receptor Agonists - semaglutide Failed - 12/29/2023 12:37 PM      Failed - HBA1C in normal range and within 180 days    Hgb A1c MFr Bld  Date Value Ref Range Status  03/23/2023 5.6 <5.7 % of total Hgb Final    Comment:    For the purpose of screening for the presence of diabetes: . <5.7%       Consistent with the absence of diabetes 5.7-6.4%    Consistent with increased risk for diabetes             (prediabetes) > or =6.5%  Consistent with diabetes . This assay result is consistent with a decreased risk of diabetes. . Currently, no consensus exists regarding use of hemoglobin A1c for diagnosis of diabetes in children. . According to American Diabetes Association (ADA) guidelines, hemoglobin A1c <7.0% represents optimal control in non-pregnant diabetic patients. Different metrics may apply to specific patient populations.  Standards of Medical Care in Diabetes(ADA). .          Failed - Valid encounter within last 6 months    Recent Outpatient Visits           6 months ago Diarrhea, unspecified type   [place] [facility] [place], Netta Neat, DO   9 months ago Morbid obesity with BMI of 45.0-49.9, adult [facility])   [facility] [place], Netta Neat, DO   1 year ago Acute conjunctivitis of left eye, unspecified acute conjunctivitis type   [facility] Smitty Cords, DO   2 years ago Acquired hypothyroidism   [place] [facility] Smitty Cords, DO              Passed - Cr in normal range and within 360 days    Creat  Date Value Ref Range Status  06/23/2023 0.78 0.50 - 0.97 mg/dL Final

## 2023-12-30 NOTE — Addendum Note (Signed)
Addended by: Smitty Cords on: 12/30/2023 12:53 PM   Modules accepted: Orders

## 2023-12-30 NOTE — Telephone Encounter (Signed)
FYI - can notify patient Handwritten order for Semaglutide 1mg  weekly ready to be faxed to [facility]. Hopefully they can have the medicine ready next week. Depends on when this is faxed today or if it is faxed on Monday.  Saralyn Pilar, DO [facility] 12/30/2023, 12:52 PM

## 2024-04-19 ENCOUNTER — Other Ambulatory Visit: Payer: Self-pay | Admitting: Family Medicine

## 2024-04-19 NOTE — Telephone Encounter (Signed)
 Copied from CRM [id number]. Topic: Clinical - Medication Refill >> Apr 19, 2024  8:11 AM Carlatta H wrote: Medication: Semaglutide -Weight Management 1 MG/0.5ML SOAJ [[id number]]  Has the patient contacted their pharmacy? No (Agent: If no, request that the patient contact the pharmacy for the refill. If patient does not wish to contact the pharmacy document the reason why and proceed with request.) (Agent: If yes, when and what did the pharmacy advise?)  This is the patient's preferred pharmacy:    [facility] - [place], [place] - [address] [place] [address] Phone: [phone] Fax: [phone]  Is this the correct pharmacy for this prescription? Yes If no, delete pharmacy and type the correct one.   Has the prescription been filled recently? No  Is the patient out of the medication? Yes  Has the patient been seen for an appointment in the last year OR does the patient have an upcoming appointment? Yes  Can we respond through MyChart? Yes  Agent: Please be advised that Rx refills may take up to 3 business days. We ask that you follow-up with your pharmacy.

## 2024-04-20 NOTE — Telephone Encounter (Signed)
 Requested medication (s) are due for refill today: yes  Requested medication (s) are on the active medication list: hx med  Last refill:  12/30/23 amount dispensed not specified  Future visit scheduled: no  Notes to clinic:  Routing for clarification   Requested Prescriptions  Pending Prescriptions Disp Refills   Semaglutide -Weight Management 1 MG/0.5ML SOAJ 2 mL     Sig: Inject 1 mg into the skin once a week.     Endocrinology:  Diabetes - GLP-1 Receptor Agonists - semaglutide  Failed - 04/20/2024 11:29 AM      Failed - HBA1C in normal range and within 180 days    Hgb A1c MFr Bld  Date Value Ref Range Status  03/23/2023 5.6 <5.7 % of total Hgb Final    Comment:    For the purpose of screening for the presence of diabetes: . <5.7%       Consistent with the absence of diabetes 5.7-6.4%    Consistent with increased risk for diabetes             (prediabetes) > or =6.5%  Consistent with diabetes . This assay result is consistent with a decreased risk of diabetes. . Currently, no consensus exists regarding use of hemoglobin A1c for diagnosis of diabetes in children. . According to American Diabetes Association (ADA) guidelines, hemoglobin A1c <7.0% represents optimal control in non-pregnant diabetic patients. Different metrics may apply to specific patient populations.  Standards of Medical Care in Diabetes(ADA). .          Failed - Valid encounter within last 6 months    Recent Outpatient Visits   None            Passed - Cr in normal range and within 360 days    Creat  Date Value Ref Range Status  06/23/2023 0.78 0.50 - 0.97 mg/dL Final

## 2024-04-24 MED ORDER — SEMAGLUTIDE-WEIGHT MANAGEMENT 1 MG/0.5ML ~~LOC~~ SOAJ
1.0000 mg | SUBCUTANEOUS | 2 refills | Status: AC
Start: 2024-04-24 — End: ?

## 2024-04-24 NOTE — Telephone Encounter (Signed)
 Thank you for confirming! I called [facility] and phoned in this refill for Semaglutide  compound 1mg  weekly injection (1 month order) + 2 additional refills. They will work on processing the order and contact her when ready.  Domingo Friend, DO [facility] 04/24/2024, 2:11 PM

## 2024-04-24 NOTE — Telephone Encounter (Signed)
 Please call patient to clarify this request. [facility] should still have access to ordering these weight loss meds for a period of time.  Can you confirm which dosage she is looking for compounded Semaglutide  and how many refills? It looks like it was at 1mg  weekly before. It goes up to 1.7 and 2.4 are the next highest doses.  We should be able to phone in the refill to [facility] this week. Once her dose is confirmed.  Domingo Friend, DO [facility] 04/24/2024, 1:17 PM

## 2024-04-27 ENCOUNTER — Encounter: Payer: Self-pay | Admitting: Family Medicine

## 2024-12-25 ENCOUNTER — Telehealth (INDEPENDENT_AMBULATORY_CARE_PROVIDER_SITE_OTHER): Payer: Self-pay | Admitting: Family Medicine

## 2024-12-25 ENCOUNTER — Encounter: Payer: Self-pay | Admitting: Family Medicine

## 2024-12-25 DIAGNOSIS — L03316 Cellulitis of umbilicus: Secondary | ICD-10-CM | POA: Diagnosis not present

## 2024-12-25 MED ORDER — AMOXICILLIN-POT CLAVULANATE 875-125 MG PO TABS: 1.0000 | ORAL_TABLET | Freq: Two times a day (BID) | ORAL | 0 refills | Status: AC

## 2024-12-25 MED ORDER — MUPIROCIN 2 % EX OINT: 1.0000 | TOPICAL_OINTMENT | Freq: Two times a day (BID) | CUTANEOUS | 0 refills | Status: AC

## 2024-12-25 NOTE — Progress Notes (Signed)
 "  Subjective:    Patient ID: [name], female    DOB: 1990-12-31, 34 y.o.   MRN: [id number]  Anne Henderson is a 34 y.o. female presenting on 12/25/2024 for Cellulitis of umbilicus   Virtual / Telehealth Encounter - Video Visit via MyChart The purpose of this virtual visit is to provide medical care while limiting exposure to the novel coronavirus (COVID19) for both patient and office staff.  Consent was obtained for remote visit:  Yes.   Answered questions that patient had about telehealth interaction:  Yes.   I discussed the limitations, risks, security and privacy concerns of performing an evaluation and management service by video/telephone. I also discussed with the patient that there may be a patient responsible charge related to this service. The patient expressed understanding and agreed to proceed.  Patient Location: Home Provider Location: [facility] (Office)  Participants in virtual visit: - Patient: Yajaira Doffing - CMA: Benton Herald CMA - Provider: Dr Edman   HPI  Umbilicus Erythema Recent acute onset identified red chapped irritated inflamed skin of inner aspect of umbilicus, not spreading outwards Tried aquaphor already, she has known chronic sensitive skin and multiple allergy triggers avoids detergents that can worsen allergies Currently has COVID infection as well  Denies systemic fevers spreading redness drainage from umbilicus      03/18/2023    4:28 PM 05/07/2021   10:48 AM  Depression screen PHQ 2/9  Decreased Interest 0 0  Down, Depressed, Hopeless 0 0  PHQ - 2 Score 0 0  Altered sleeping 0   Tired, decreased energy 1   Change in appetite 0   Feeling bad or failure about yourself  0   Trouble concentrating 0   Moving slowly or fidgety/restless 0   Suicidal thoughts 0   PHQ-9 Score 1    Difficult doing work/chores Not difficult at all      Data saved with a previous flowsheet row definition       03/18/2023    4:28 PM   GAD 7 : Generalized Anxiety Score  Nervous, Anxious, on Edge 0   Control/stop worrying 0   Worry too much - different things 0   Trouble relaxing 0   Restless 0   Easily annoyed or irritable 0   Afraid - awful might happen 0   Total GAD 7 Score 0  Anxiety Difficulty Not difficult at all     Data saved with a previous flowsheet row definition    Social History[1]  Review of Systems Per HPI unless specifically indicated above     Objective:    There were no vitals taken for this visit.  Wt Readings from Last 3 Encounters:  03/18/23 296 lb 12.8 oz (134.6 kg)  12/30/21 284 lb 6.4 oz (129 kg)  05/07/21 (!) 302 lb 9.6 oz (137.3 kg)     Physical Exam  Note examination was completely remotely via video observation objective data only  Gen - well-appearing, no acute distress or apparent pain, comfortable HEENT - eyes appear clear without discharge or redness Heart/Lungs - cannot examine virtually - observed no evidence of coughing or labored breathing. Abd / Skin - she is able to show her umbilicus region today on video call and it is confirmed to have erythematous appearance of inner skin Neuro - awake, alert, oriented Psych - not anxious appearing   Results for orders placed or performed in visit on 06/23/23  Lipase   Collection Time: 06/23/23  9:55 AM  Result Value Ref Range   Lipase 16 7 - 60 U/L  CBC with Differential/Platelet   Collection Time: 06/23/23  9:55 AM  Result Value Ref Range   WBC 6.1 3.8 - 10.8 Thousand/uL   RBC 4.16 3.80 - 5.10 Million/uL   Hemoglobin 12.3 11.7 - 15.5 g/dL   HCT 63.2 64.9 - 54.9 %   MCV 88.2 80.0 - 100.0 fL   MCH 29.6 27.0 - 33.0 pg   MCHC 33.5 32.0 - 36.0 g/dL   RDW 87.5 88.9 - 84.9 %   Platelets 270 140 - 400 Thousand/uL   MPV 10.5 7.5 - 12.5 fL   Neutro Abs 4,063 1,500 - 7,800 cells/uL   Lymphs Abs 1,409 850 - 3,900 cells/uL   Absolute Monocytes 445 200 - 950 cells/uL   Eosinophils Absolute 140 15 - 500 cells/uL    Basophils Absolute 43 0 - 200 cells/uL   Neutrophils Relative % 66.6 %   Total Lymphocyte 23.1 %   Monocytes Relative 7.3 %   Eosinophils Relative 2.3 %   Basophils Relative 0.7 %  COMPLETE METABOLIC PANEL WITH GFR   Collection Time: 06/23/23  9:55 AM  Result Value Ref Range   Glucose, Bld 101 (H) 65 - 99 mg/dL   BUN 9 7 - 25 mg/dL   Creat 9.21 9.49 - 9.02 mg/dL   eGFR 895 > OR = 60 fO/fpw/8.26f7   BUN/Creatinine Ratio SEE NOTE: 6 - 22 (calc)   Sodium 139 135 - 146 mmol/L   Potassium 4.0 3.5 - 5.3 mmol/L   Chloride 108 98 - 110 mmol/L   CO2 24 20 - 32 mmol/L   Calcium 9.3 8.6 - 10.2 mg/dL   Total Protein 6.3 6.1 - 8.1 g/dL   Albumin 4.2 3.6 - 5.1 g/dL   Globulin 2.1 1.9 - 3.7 g/dL (calc)   AG Ratio 2.0 1.0 - 2.5 (calc)   Total Bilirubin 0.3 0.2 - 1.2 mg/dL   Alkaline phosphatase (APISO) 46 31 - 125 U/L   AST 15 10 - 30 U/L   ALT 18 6 - 29 U/L  Gastrointestinal Pathogen Pnl RT, PCR   Collection Time: 06/27/23  7:55 AM  Result Value Ref Range   CampyloBacter Group NOT DETECTED NOT DETECTED   Salmonella species NOT DETECTED NOT DETECTED   Shigella Species NOT DETECTED NOT DETECTED   Vibrio Group NOT DETECTED NOT DETECTED   Yersinia enterocolitica NOT DETECTED NOT DETECTED   Shiga Toxin 1 NOT DETECTED NOT DETECTED   Shiga Toxin 2 NOT DETECTED NOT DETECTED   Norovirus GI/GII NOT DETECTED NOT DETECTED   Rotavirus A NOT DETECTED NOT DETECTED  Gastrointestinal Pathogen Pnl RT, PCR   Collection Time: 06/27/23  7:55 AM  Result Value Ref Range   CampyloBacter Group CANCELED       Assessment & Plan:   Problem List Items Addressed This Visit   None Visit Diagnoses       Cellulitis of umbilicus    -  Primary   Relevant Medications   amoxicillin -clavulanate (AUGMENTIN ) 875-125 MG tablet   mupirocin  ointment (BACTROBAN ) 2 %       Suspected cellulitis of umbilicus With localized erythematous inflamed appearance of skin on inside of umbilicus Uncertain exact etiology of no  new triggers or injury or recent cyst or scab or infection locally Will pursue broad spectrum coverage with Augmentin  oral and add Bactroban  mupirocin  topical course Follow-up precautions reviewed if worsening or spreading infection Consider imaging if indicated or general surgery  consult if not improving may warrant further evaluation   No orders of the defined types were placed in this encounter.   Meds ordered this encounter  Medications   amoxicillin -clavulanate (AUGMENTIN ) 875-125 MG tablet    Sig: Take 1 tablet by mouth 2 (two) times daily.    Dispense:  20 tablet    Refill:  0   mupirocin  ointment (BACTROBAN ) 2 %    Sig: Apply 1 Application topically 2 (two) times daily. For 1-2 weeks    Dispense:  22 g    Refill:  0    Follow up plan: Return if symptoms worsen or fail to improve.   Patient verbalizes understanding with the above medical recommendations including the limitation of remote medical advice.  Specific follow-up and call-back criteria were given for patient to follow-up or seek medical care more urgently if needed.  Total duration of direct patient care provided via video conference: 7 minutes   Marsa Officer, DO [facility] 12/25/2024, 3:34 PM     [1]  Social History Tobacco Use   Smoking status: Never   Smokeless tobacco: Never  Vaping Use   Vaping status: Never Used  Substance Use Topics   Alcohol use: Yes    Alcohol/week: 3.0 standard drinks of alcohol    Types: 3 Cans of beer per week   Drug use: Never   "

## 2024-12-25 NOTE — Patient Instructions (Addendum)
